# Patient Record
Sex: Male | Born: 1985 | Race: White | Hispanic: No | Marital: Married | Smoking: Never smoker
Health system: Southern US, Academic
[De-identification: ages and names within clinical notes are randomized; demographics above are authoritative.]

## PROBLEM LIST (undated history)

## (undated) DIAGNOSIS — E119 Type 2 diabetes mellitus without complications: Secondary | ICD-10-CM

## (undated) DIAGNOSIS — E7800 Pure hypercholesterolemia, unspecified: Secondary | ICD-10-CM

## (undated) DIAGNOSIS — F419 Anxiety disorder, unspecified: Secondary | ICD-10-CM

## (undated) DIAGNOSIS — G2581 Restless legs syndrome: Secondary | ICD-10-CM

## (undated) HISTORY — PX: HEMORROIDECTOMY: SUR656

## (undated) HISTORY — DX: Restless legs syndrome: G25.81

## (undated) HISTORY — DX: Type 2 diabetes mellitus without complications (CMS HCC): E11.9

## (undated) HISTORY — DX: Pure hypercholesterolemia, unspecified: E78.00

## (undated) HISTORY — DX: Anxiety disorder, unspecified: F41.9

---

## 2015-11-25 ENCOUNTER — Ambulatory Visit (HOSPITAL_COMMUNITY): Payer: Self-pay

## 2016-07-26 DIAGNOSIS — R454 Irritability and anger: Secondary | ICD-10-CM

## 2016-07-26 DIAGNOSIS — Z6841 Body Mass Index (BMI) 40.0 and over, adult: Secondary | ICD-10-CM

## 2016-07-26 DIAGNOSIS — H65191 Other acute nonsuppurative otitis media, right ear: Secondary | ICD-10-CM

## 2016-07-26 DIAGNOSIS — F419 Anxiety disorder, unspecified: Secondary | ICD-10-CM

## 2016-10-16 DIAGNOSIS — Z021 Encounter for pre-employment examination: Secondary | ICD-10-CM

## 2016-12-12 DIAGNOSIS — E1140 Type 2 diabetes mellitus with diabetic neuropathy, unspecified: Secondary | ICD-10-CM

## 2016-12-12 DIAGNOSIS — G2581 Restless legs syndrome: Secondary | ICD-10-CM

## 2016-12-12 DIAGNOSIS — F319 Bipolar disorder, unspecified: Secondary | ICD-10-CM

## 2016-12-12 DIAGNOSIS — Z6841 Body Mass Index (BMI) 40.0 and over, adult: Secondary | ICD-10-CM

## 2016-12-12 DIAGNOSIS — E785 Hyperlipidemia, unspecified: Secondary | ICD-10-CM

## 2017-04-05 DIAGNOSIS — Z6841 Body Mass Index (BMI) 40.0 and over, adult: Secondary | ICD-10-CM

## 2017-04-05 DIAGNOSIS — J208 Acute bronchitis due to other specified organisms: Secondary | ICD-10-CM

## 2017-04-05 DIAGNOSIS — E119 Type 2 diabetes mellitus without complications: Secondary | ICD-10-CM

## 2017-04-27 DIAGNOSIS — E119 Type 2 diabetes mellitus without complications: Secondary | ICD-10-CM

## 2017-04-27 DIAGNOSIS — Z6841 Body Mass Index (BMI) 40.0 and over, adult: Secondary | ICD-10-CM

## 2017-04-27 DIAGNOSIS — J208 Acute bronchitis due to other specified organisms: Secondary | ICD-10-CM

## 2017-04-27 DIAGNOSIS — G2581 Restless legs syndrome: Secondary | ICD-10-CM

## 2017-05-16 DIAGNOSIS — J208 Acute bronchitis due to other specified organisms: Secondary | ICD-10-CM

## 2017-05-16 DIAGNOSIS — E119 Type 2 diabetes mellitus without complications: Secondary | ICD-10-CM

## 2017-06-12 ENCOUNTER — Other Ambulatory Visit (INDEPENDENT_AMBULATORY_CARE_PROVIDER_SITE_OTHER): Payer: Self-pay | Admitting: Family Medicine

## 2017-06-15 ENCOUNTER — Other Ambulatory Visit (INDEPENDENT_AMBULATORY_CARE_PROVIDER_SITE_OTHER): Payer: Self-pay | Admitting: Family Medicine

## 2017-06-15 NOTE — Telephone Encounter (Signed)
PER FAX  1 PO QD QTY 90

## 2017-06-16 ENCOUNTER — Other Ambulatory Visit (INDEPENDENT_AMBULATORY_CARE_PROVIDER_SITE_OTHER): Payer: Self-pay | Admitting: Family Medicine

## 2017-06-16 MED ORDER — GLIMEPIRIDE 1 MG TABLET
1.00 mg | ORAL_TABLET | Freq: Every morning | ORAL | 1 refills | Status: DC
Start: 2017-06-16 — End: 2017-06-18

## 2017-06-18 ENCOUNTER — Other Ambulatory Visit (INDEPENDENT_AMBULATORY_CARE_PROVIDER_SITE_OTHER): Payer: Self-pay | Admitting: Family Medicine

## 2017-06-18 MED ORDER — GLIMEPIRIDE 1 MG TABLET
1.0000 mg | ORAL_TABLET | Freq: Every morning | ORAL | 1 refills | Status: AC
Start: 2017-06-18 — End: ?

## 2017-07-01 ENCOUNTER — Other Ambulatory Visit (INDEPENDENT_AMBULATORY_CARE_PROVIDER_SITE_OTHER): Payer: Self-pay | Admitting: Family Medicine

## 2017-07-08 ENCOUNTER — Other Ambulatory Visit (INDEPENDENT_AMBULATORY_CARE_PROVIDER_SITE_OTHER): Payer: Self-pay | Admitting: Family Medicine

## 2017-07-10 ENCOUNTER — Other Ambulatory Visit (INDEPENDENT_AMBULATORY_CARE_PROVIDER_SITE_OTHER): Payer: Self-pay | Admitting: Family Medicine

## 2017-07-25 ENCOUNTER — Other Ambulatory Visit (INDEPENDENT_AMBULATORY_CARE_PROVIDER_SITE_OTHER): Payer: Self-pay | Admitting: Family Medicine

## 2017-07-25 MED ORDER — CITALOPRAM 40 MG TABLET
40.0000 mg | ORAL_TABLET | Freq: Every day | ORAL | 1 refills | Status: DC
Start: 2017-07-25 — End: 2018-02-01

## 2017-07-26 ENCOUNTER — Encounter (INDEPENDENT_AMBULATORY_CARE_PROVIDER_SITE_OTHER): Payer: Self-pay | Admitting: Family Medicine

## 2017-07-31 ENCOUNTER — Other Ambulatory Visit (INDEPENDENT_AMBULATORY_CARE_PROVIDER_SITE_OTHER): Payer: Self-pay | Admitting: Family Medicine

## 2017-07-31 MED ORDER — LIRAGLUTIDE 0.6 MG/0.1 ML (18 MG/3 ML) SUBCUTANEOUS PEN INJECTOR
1.2000 mg | PEN_INJECTOR | Freq: Every day | SUBCUTANEOUS | 1 refills | Status: DC
Start: 2017-07-31 — End: 2017-08-01

## 2017-08-01 ENCOUNTER — Other Ambulatory Visit (INDEPENDENT_AMBULATORY_CARE_PROVIDER_SITE_OTHER): Payer: Self-pay | Admitting: Family Medicine

## 2017-08-01 MED ORDER — LIRAGLUTIDE 0.6 MG/0.1 ML (18 MG/3 ML) SUBCUTANEOUS PEN INJECTOR
1.2000 mg | PEN_INJECTOR | Freq: Every day | SUBCUTANEOUS | 1 refills | Status: DC
Start: 2017-08-01 — End: 2017-08-09

## 2017-08-09 ENCOUNTER — Encounter (INDEPENDENT_AMBULATORY_CARE_PROVIDER_SITE_OTHER): Payer: Self-pay | Admitting: Family Medicine

## 2017-08-09 ENCOUNTER — Encounter (INDEPENDENT_AMBULATORY_CARE_PROVIDER_SITE_OTHER): Payer: Self-pay

## 2017-08-09 ENCOUNTER — Other Ambulatory Visit: Payer: 59 | Attending: Family Medicine | Admitting: Family Medicine

## 2017-08-09 ENCOUNTER — Ambulatory Visit (INDEPENDENT_AMBULATORY_CARE_PROVIDER_SITE_OTHER): Payer: 59 | Admitting: Family Medicine

## 2017-08-09 ENCOUNTER — Other Ambulatory Visit (INDEPENDENT_AMBULATORY_CARE_PROVIDER_SITE_OTHER): Payer: Self-pay | Admitting: Family Medicine

## 2017-08-09 VITALS — BP 124/86 | HR 63 | Temp 97.3°F | Resp 14 | Ht 72.0 in | Wt 334.0 lb

## 2017-08-09 DIAGNOSIS — E782 Mixed hyperlipidemia: Secondary | ICD-10-CM | POA: Insufficient documentation

## 2017-08-09 DIAGNOSIS — F419 Anxiety disorder, unspecified: Secondary | ICD-10-CM

## 2017-08-09 DIAGNOSIS — G2581 Restless legs syndrome: Secondary | ICD-10-CM

## 2017-08-09 DIAGNOSIS — E119 Type 2 diabetes mellitus without complications: Secondary | ICD-10-CM | POA: Insufficient documentation

## 2017-08-09 DIAGNOSIS — Z6841 Body Mass Index (BMI) 40.0 and over, adult: Secondary | ICD-10-CM

## 2017-08-09 LAB — BASIC METABOLIC PANEL, FASTING
BUN/CREA RATIO: 8
BUN: 10 mg/dL (ref 10–25)
CALCIUM: 9.3 mg/dL (ref 8.2–10.2)
CHLORIDE: 106 mmol/L (ref 98–111)
CO2 TOTAL: 21 mmol/L (ref 21–35)
CREATININE: 1.2 mg/dL (ref <=1.30)
ESTIMATED GFR: >60 mL/min/{1.73_m2}
GLUCOSE: 113 mg/dL — ABNORMAL HIGH (ref 70–110)
POTASSIUM: 3.7 mmol/L (ref 3.5–5.0)
SODIUM: 137 mmol/L (ref 135–145)

## 2017-08-09 LAB — CBC WITH DIFF
BASOPHIL #: 0.1 10*3/uL (ref 0.00–0.20)
BASOPHIL #: 0.1 10*3/uL (ref 0.00–0.20)
BASOPHIL %: 1 %
EOSINOPHIL #: 0.2 10*3/uL (ref 0.00–0.50)
EOSINOPHIL %: 2 %
HCT: 42.9 % (ref 38.9–50.5)
HCT: 42.9 % (ref 38.9–50.5)
HGB: 14.8 g/dL (ref 13.4–17.3)
LYMPHOCYTE #: 3.9 10*3/uL — ABNORMAL HIGH (ref 0.80–3.20)
LYMPHOCYTE %: 41 %
MCH: 29.5 pg (ref 27.9–33.1)
MCHC: 34.5 g/dL (ref 32.8–36.0)
MCV: 85.5 fL (ref 82.4–95.0)
MONOCYTE #: 0.6 10*3/uL (ref 0.20–0.80)
MONOCYTE %: 7 %
MPV: 8.4 fL (ref 6.0–10.2)
NEUTROPHIL #: 4.8 10*3/uL (ref 1.60–5.50)
NEUTROPHIL %: 50 %
PLATELETS: 290 10*3/uL (ref 140–440)
RBC: 5.01 10*6/uL (ref 4.40–5.68)
RDW: 13.7 % (ref 10.9–15.1)
WBC: 9.5 10*3/uL — ABNORMAL HIGH (ref 3.3–9.3)

## 2017-08-09 LAB — HEPATIC FUNCTION PANEL
ALBUMIN: 4.4 g/dL (ref 3.2–4.6)
ALKALINE PHOSPHATASE: 74 U/L (ref 20–130)
ALT (SGPT): 38 U/L (ref <=52)
AST (SGOT): 23 U/L (ref <=35)
BILIRUBIN DIRECT: 0.1 mg/dL (ref <=0.3)
BILIRUBIN TOTAL: 0.5 mg/dL (ref 0.3–1.2)
PROTEIN TOTAL: 6.9 g/dL (ref 6.0–8.3)

## 2017-08-09 LAB — LIPID PANEL
CHOLESTEROL: 186 mg/dL (ref 0–199)
HDL CHOL: 41 mg/dL (ref >40)
LDL DIRECT: 112 mg/dL — ABNORMAL HIGH (ref 0–99)
TRIGLYCERIDES: 354 mg/dL — ABNORMAL HIGH (ref 0–199)
VLDL CALC: 71 mg/dL — ABNORMAL HIGH (ref 0–50)

## 2017-08-09 MED ORDER — LIRAGLUTIDE 0.6 MG/0.1 ML (18 MG/3 ML) SUBCUTANEOUS PEN INJECTOR
1.2000 mg | PEN_INJECTOR | Freq: Every day | SUBCUTANEOUS | 4 refills | Status: AC
Start: 2017-08-09 — End: ?

## 2017-08-09 NOTE — Nursing Note (Signed)
08/09/17 0805   Adult Vitals   Initials KG   BP (Non-Invasive) 124/86   Temperature 36.3 C (97.3 F)   Temp src Oral   Heart Rate 63   Respiratory Rate 14   SpO2-1 98 %   Height 1.829 m (6')   Weight (!) 151.5 kg (334 lb)

## 2017-08-09 NOTE — Progress Notes (Signed)
[facility]  [facility]  [address], [address]  [name] [facility] [place] [id number]  [phone]    Francisco Hebert 08/09/2017   [id number] [birth year]     Chief Complaint: Follow Up 3 Months (DM 2)      Subjective: Francisco Hebert is a 31 y.o. male presenting with follow-up of diabetes mellitus type 2.  He feels that his fingersticks are still doing really bad.  He states that some mornings he has fingersticks as high as 300.  He has notice that if he drinks a lot of water is fingersticks can come down into the 100s.  He is using Victoza 1.2 mg weekly but feels that he is not getting good control of his blood sugars.  He is also not lost any weight.  He states he is exercising when trying to eat right.  He had his flu shot last week.  Is not having any problems with any other medication.  He feels his mood is doing well.  Has some end of the day swelling of his legs.  He is fasting today for labs.    Medical History     I have reviewed and updated as appropriate the past medical, family and social history today:  Medical History/Surgical History/Family History  No past medical history on file.  {Medical History updated    No past surgical history on file.  Family Medical History:     None            Allergies:  Allergies   Allergen Reactions   . Cefdinir    . Diclofenac Potassium      Medications:  Current Outpatient Prescriptions   Medication Sig   . atorvastatin (LIPITOR) 10 mg Oral Tablet TAKE 1 TABLET BY MOUTH EVERY DAY   . busPIRone (BUSPAR) 15 mg Oral Tablet Take 0.5 tablets twice a day by oral route.   . citalopram (CELEXA) 40 mg Oral Tablet Take 1 Tab (40 mg total) by mouth Once a day   . FLUZONE QUAD 2018-2019, PF, 60 mcg (15 mcg x 4)/0.5 mL Intramuscular Syringe TO BE ADMINISTERED BY PHARMACIST FOR IMMUNIZATION   . glimepiride (AMARYL) 1 mg Oral Tablet Take 1 Tab (1 mg total) by mouth Every morning   . liraglutide (VICTOZA 2-PAK) 0.6 mg/0.1 mL (18 mg/3 mL) Subcutaneous  Pen Injector 1.2 mg by Subcutaneous route Once a day   . NOVOFINE 32 32 gauge x 1/4" Needle USE FOR INJECTION DAILY   . ONETOUCH ULTRA BLUE TEST STRIP Does not apply Strip USE FOR TESTING TWICE A DAY   . topiramate (TOPAMAX) 50 mg Oral Tablet TAKE 1 TABLET BY MOUTH ONCE DAILY X7 DAYS.THEN TAKE 1 TABLET TWICE A DAY   . traZODone (DESYREL) 150 mg Oral Tablet Take 75 mg by mouth Once a day     Immunization History:    There is no immunization history on file for this patient.  Problem List:  There are no active problems to display for this patient.      Review of Systems:  Constitutional:within normal limits   HEENT: within normal limits  Respiratory: within normal limits  Cardiovascular: within normal limits  Gastrointestinal: within normal limits   GU: dysuria No hematuria No   frequency No nocturia No  Musculoskeletal: within normal limits   Neurological: within normal limits   Psych; normal mood and affect  skin: Denies rashes or skin changes  All other ROS Negative  Objective   Vitals:  BP 124/86  Pulse 63  Temp 36.3 C (97.3 F) (Oral)   Resp 14  Ht 1.829 m (6')  Wt (!) 151.5 kg (334 lb)  SpO2 98%  BMI 45.3 kg/m2  Wt Readings from Last 5 Encounters:   08/09/17 (!) 151.5 kg (334 lb)     Body mass index is 45.3 kg/(m^2).    Physical Exam:  Constitutional: well developed well nourished, in no acute distress  HEENT: Conjunctiva normal, external ears normal, no nasal drainage  Neck: Normal alignment, FROM, full strength, no instability  Cardiac: RRR, no murmurs, rubs or gallops  Pulmonary: Clear Bilaterally  ABD: non distended, normal BS   MS:   Equal muscle strength  Neuro: negative  Skin:   No rash  Psych: normal mood and affect    Laboratory Studies/Data Reviewed     I have reviewed available lab studies and updated the patient on the results.  No results found for any previous visit.  I have reviewed all health maintenance items with the patient.  Health Maintenance   Topic Date Due   . Depression  Screening  11/20/1996   . HIV Screening  11/21/1999   . Adult Tdap-Td (1 - Tdap) 11/21/2003   . Pneumococcal 19-64 Years Medium Risk (1 of 1 - PPSV23) 11/21/2003   . Influenza Vaccine (1) 06/02/2017       Assessment/Plan   Diagnosis    ICD-10-CM    1. DM2 (diabetes mellitus, type 2) (CMS HCC) E11.9 CBC/DIFF     BASIC METABOLIC PANEL, FASTING     HGA1C (HEMOGLOBIN A1C WITH EST AVG GLUCOSE)     POCT BLOOD DRAW (AMB)   2. 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 RLS (restless legs syndrome) G25.81    4. Anxiety F41.9        Plan  We will obtain fasting labs today.  Tells me that his Victoza is costing him over 100 dollars a month.  I gave him an insurance card that should reduce his co-pay.  After a sees A1c I may increase his Victoza to 1.8 mg he is also supposed to check and see if he can obtain a formulary for his insurance.  I encouraged him to keep exercising and eating right.  Will see him again in about 4 months.  BMI addressed: Advised on diet, weight loss, and exercise to reduce above normal BMI.              Levert FeinsteinFrank D Stefania Goulart, DO  08/09/2017, 08:21

## 2017-08-10 LAB — HGA1C (HEMOGLOBIN A1C WITH EST AVG GLUCOSE)
ESTIMATED AVERAGE GLUCOSE: 169 mg/dL
HEMOGLOBIN A1C: 7.5 % — ABNORMAL HIGH (ref 4.1–5.7)

## 2017-08-16 ENCOUNTER — Encounter (INDEPENDENT_AMBULATORY_CARE_PROVIDER_SITE_OTHER): Payer: Self-pay | Admitting: Family Medicine

## 2017-08-17 ENCOUNTER — Other Ambulatory Visit (INDEPENDENT_AMBULATORY_CARE_PROVIDER_SITE_OTHER): Payer: Self-pay | Admitting: Family Medicine

## 2017-08-17 MED ORDER — ATORVASTATIN 20 MG TABLET
20.0000 mg | ORAL_TABLET | Freq: Every day | ORAL | 2 refills | Status: AC
Start: 2017-08-17 — End: ?

## 2017-09-14 ENCOUNTER — Other Ambulatory Visit (INDEPENDENT_AMBULATORY_CARE_PROVIDER_SITE_OTHER): Payer: Self-pay | Admitting: Family Medicine

## 2017-09-14 MED ORDER — BLOOD-GLUCOSE METER
1.0000 | Freq: Two times a day (BID) | 0 refills | Status: AC
Start: 2017-09-14 — End: ?

## 2017-09-14 MED ORDER — BLOOD SUGAR DIAGNOSTIC STRIPS
1.0000 | ORAL_STRIP | Freq: Two times a day (BID) | 4 refills | Status: DC
Start: 2017-09-14 — End: 2017-12-14

## 2017-10-17 ENCOUNTER — Other Ambulatory Visit (INDEPENDENT_AMBULATORY_CARE_PROVIDER_SITE_OTHER): Payer: Self-pay | Admitting: Family Medicine

## 2017-11-05 ENCOUNTER — Other Ambulatory Visit (INDEPENDENT_AMBULATORY_CARE_PROVIDER_SITE_OTHER): Payer: Self-pay | Admitting: Family Medicine

## 2017-12-07 ENCOUNTER — Encounter (INDEPENDENT_AMBULATORY_CARE_PROVIDER_SITE_OTHER): Payer: Self-pay | Admitting: Family Medicine

## 2017-12-14 ENCOUNTER — Other Ambulatory Visit (INDEPENDENT_AMBULATORY_CARE_PROVIDER_SITE_OTHER): Payer: Self-pay | Admitting: Family Medicine

## 2018-02-01 ENCOUNTER — Other Ambulatory Visit (INDEPENDENT_AMBULATORY_CARE_PROVIDER_SITE_OTHER): Payer: Self-pay | Admitting: Family Medicine

## 2020-05-26 ENCOUNTER — Other Ambulatory Visit: Payer: Self-pay

## 2020-12-15 ENCOUNTER — Other Ambulatory Visit: Payer: Self-pay | Admitting: Nurse Practitioner

## 2020-12-15 DIAGNOSIS — N289 Disorder of kidney and ureter, unspecified: Secondary | ICD-10-CM

## 2020-12-16 ENCOUNTER — Ambulatory Visit
Admission: RE | Admit: 2020-12-16 | Discharge: 2020-12-16 | Disposition: A | Discharge status: Home / Self Care | Payer: Commercial Managed Care - PPO | Source: Ambulatory Visit | Attending: Nurse Practitioner | Admitting: Nurse Practitioner

## 2020-12-16 DIAGNOSIS — N289 Disorder of kidney and ureter, unspecified: Secondary | ICD-10-CM

## 2021-10-04 ENCOUNTER — Encounter (HOSPITAL_COMMUNITY): Payer: Self-pay

## 2021-10-04 ENCOUNTER — Emergency Department (HOSPITAL_COMMUNITY)
Admission: EM | Admit: 2021-10-04 | Discharge: 2021-10-05 | Disposition: A | Discharge status: Home / Self Care | Payer: Commercial Managed Care - PPO | Attending: Emergency Medicine | Admitting: Emergency Medicine

## 2021-10-04 ENCOUNTER — Other Ambulatory Visit: Payer: Self-pay

## 2021-10-04 DIAGNOSIS — Z79899 Other long term (current) drug therapy: Secondary | ICD-10-CM | POA: Diagnosis not present

## 2021-10-04 DIAGNOSIS — R110 Nausea: Secondary | ICD-10-CM | POA: Diagnosis not present

## 2021-10-04 DIAGNOSIS — R1011 Right upper quadrant pain: Secondary | ICD-10-CM | POA: Insufficient documentation

## 2021-10-04 HISTORY — DX: Type 2 diabetes mellitus without complications: E11.9

## 2021-10-04 LAB — COMPREHENSIVE METABOLIC PANEL
ALT: 38 U/L (ref 0–44)
AST: 34 U/L (ref 15–41)
Albumin: 4.5 g/dL (ref 3.5–5.0)
Alkaline Phosphatase: 30 U/L — ABNORMAL LOW (ref 38–126)
Anion gap: 8 (ref 5–15)
BUN: 17 mg/dL (ref 6–20)
CO2: 21 mmol/L — ABNORMAL LOW (ref 22–32)
Calcium: 9.3 mg/dL (ref 8.9–10.3)
Chloride: 108 mmol/L (ref 98–111)
Creatinine, Ser: 1.38 mg/dL — ABNORMAL HIGH (ref 0.61–1.24)
GFR, Estimated: >60 mL/min (ref >60)
Glucose, Bld: 91 mg/dL (ref 70–99)
Potassium: 3.9 mmol/L (ref 3.5–5.1)
Sodium: 137 mmol/L (ref 135–145)
Total Bilirubin: 0.4 mg/dL (ref 0.3–1.2)
Total Protein: 7.6 g/dL (ref 6.5–8.1)

## 2021-10-04 LAB — CBC
HCT: 44 % (ref 39.0–52.0)
Hemoglobin: 14.7 g/dL (ref 13.0–17.0)
MCH: 28.8 pg (ref 26.0–34.0)
MCHC: 33.4 g/dL (ref 30.0–36.0)
MCV: 86.1 fL (ref 80.0–100.0)
Platelets: 423 10*3/uL — ABNORMAL HIGH (ref 150–400)
RBC: 5.11 MIL/uL (ref 4.22–5.81)
RDW: 12.9 % (ref 11.5–15.5)
WBC: 11.5 10*3/uL — ABNORMAL HIGH (ref 4.0–10.5)
nRBC: 0 % (ref 0.0–0.2)

## 2021-10-04 LAB — LIPASE, BLOOD: Lipase: 56 U/L — ABNORMAL HIGH (ref 11–51)

## 2021-10-04 NOTE — ED Triage Notes (Signed)
Reports he hit the ground due to pain in right upper quad at work.  +diaphoresis

## 2021-10-05 ENCOUNTER — Emergency Department (HOSPITAL_COMMUNITY): Payer: Commercial Managed Care - PPO

## 2021-10-05 LAB — URINALYSIS, ROUTINE W REFLEX MICROSCOPIC
Bilirubin Urine: NEGATIVE
Glucose, UA: NEGATIVE mg/dL
Hgb urine dipstick: NEGATIVE
Ketones, ur: NEGATIVE mg/dL
Leukocytes,Ua: NEGATIVE
Nitrite: NEGATIVE
Protein, ur: NEGATIVE mg/dL
Specific Gravity, Urine: 1.025 (ref 1.005–1.030)
pH: 6 (ref 5.0–8.0)

## 2021-10-05 LAB — CBG MONITORING, ED: Glucose-Capillary: 117 mg/dL — ABNORMAL HIGH (ref 70–99)

## 2021-10-05 MED ORDER — HYDROXYZINE HCL 25 MG PO TABS
25.0000 mg | ORAL_TABLET | Freq: Once | ORAL | Status: AC
  Administered 2021-10-05: 25 mg via ORAL
  Filled 2021-10-05: qty 1

## 2021-10-05 NOTE — ED Provider Notes (Signed)
[facility] EMERGENCY DEPARTMENT Provider Note   CSN: [id number] Arrival date & time: 10/04/21  1524     History  Chief Complaint  Patient presents with   Abdominal Pain    Maxwell Crawford is a 36 y.o. male who presents to the ED today with complaint of gradual onset, intermittent, sharp/stabbing, RUQ abdominal pain that began yesterday. Pt reports the pain started after eating a banana. He states he dropped to the ground due to the amount of pain he was in. He denies LOC or head injury. He states that since then the pain has been intermittent but not as severe and currently he has 0/10 pain. He does mention issues with similar pain in the past - he states he was seen by his PCP for same and had labwork done which did not show any acute findings. Pt is concerned it could be his gallbladder. He states he was nauseated but denies vomiting. No diarrhea. Denies fevers however states he had cold chills with the pain. He does mention the pain radiates into his back. Denies urinary symptoms including hematuria.   The history is provided by the patient and medical records.      Home Medications Prior to Admission medications   Medication Sig Start Date End Date Taking? Authorizing Provider  atorvastatin (LIPITOR) 10 MG tablet Take 10 mg by mouth daily. 09/08/21  Yes [provider]  buPROPion (WELLBUTRIN XL) 150 MG 24 hr tablet Take 150 mg by mouth daily. 08/10/20  Yes [provider]  fenofibrate 160 MG tablet Take 160 mg by mouth daily. 09/08/21  Yes [provider]  hydrOXYzine (ATARAX) 10 MG tablet Take 10 mg by mouth daily. 08/23/21  Yes [provider]  metFORMIN (GLUCOPHAGE-XR) 500 MG 24 hr tablet Take 500 mg by mouth 2 (two) times daily. 09/08/21  Yes [provider]  rOPINIRole (REQUIP) 1 MG tablet Take 1 mg by mouth at bedtime. 08/23/21  Yes [provider]  traZODone (DESYREL) 100 MG tablet Take 100 mg by mouth  at bedtime. 08/11/21  Yes [provider]  TRULICITY [id number] SOPN Inject 0.75 mg into the skin every 14 (fourteen) days. 08/11/21  Yes [provider]      Allergies    Diclofenac, Cefdinir, Other, Citalopram, and Lorazepam    Review of Systems   Review of Systems  Constitutional:  Positive for chills. Negative for fever.  Cardiovascular:  Negative for chest pain.  Gastrointestinal:  Positive for abdominal pain and nausea. Negative for constipation, diarrhea and vomiting.  Neurological:  Negative for syncope.  All other systems reviewed and are negative.  Physical Exam Updated Vital Signs BP 125/87    Pulse 72    Temp 98.6 F (37 C) (Oral)    Resp 16    Ht 6' (1.829 m)    Wt 136.1 kg    SpO2 99%    BMI 40.69 kg/m  Physical Exam Vitals and nursing note reviewed.  Constitutional:      Appearance: He is obese. He is not ill-appearing or diaphoretic.  HENT:     Head: Normocephalic and atraumatic.  Eyes:     Conjunctiva/sclera: Conjunctivae normal.  Cardiovascular:     Rate and Rhythm: Normal rate and regular rhythm.     Heart sounds: Normal heart sounds.  Pulmonary:     Effort: Pulmonary effort is normal.     Breath sounds: Normal breath sounds. No wheezing, rhonchi or rales.  Abdominal:  General: Bowel sounds are normal.     Palpations: Abdomen is soft.     Tenderness: There is no abdominal tenderness. There is no right CVA tenderness, left CVA tenderness, guarding or rebound.     Comments: Soft, no obvious TTP on exam however pt points to his RUQ when describing pain, +BS throughout, no r/g/r, neg murphy's, neg mcburney's, no CVA TTP  Musculoskeletal:     Cervical back: Neck supple.  Skin:    General: Skin is warm and dry.  Neurological:     Mental Status: He is alert.    ED Results / Procedures / Treatments   Labs (all labs ordered are listed, but only abnormal results are displayed) Labs Reviewed  LIPASE, BLOOD - Abnormal; Notable for the  following components:      Result Value   Lipase 56 (*)    All other components within normal limits  COMPREHENSIVE METABOLIC PANEL - Abnormal; Notable for the following components:   CO2 21 (*)    Creatinine, Ser 1.38 (*)    Alkaline Phosphatase 30 (*)    All other components within normal limits  CBC - Abnormal; Notable for the following components:   WBC 11.5 (*)    Platelets 423 (*)    All other components within normal limits  CBG MONITORING, ED - Abnormal; Notable for the following components:   Glucose-Capillary 117 (*)    All other components within normal limits  URINALYSIS, ROUTINE W REFLEX MICROSCOPIC    EKG None  Radiology No results found.  Procedures Procedures    Medications Ordered in ED Medications - No data to display  ED Course/ Medical Decision Making/ A&P                           Medical Decision Making 36 year old male who presents to the ED today with complaint of intermittent right upper quadrant pain, severe nature yesterday after eating a banana with associated nausea and chills.  On arrival to the ED today vitals are stable.  Patient was medically screened and work-up started including lab work.  BC with mild elevation in leukocytosis at 11,500.  Hemoglobin stable at 14.7.  Platelets 423.  No other abnormalities.  CMP with creatinine of 1.38, does appear baseline for patient; 17.  Kos 91, bicarb 21.  No gap.  Lipase slightly elevated at 56 however not consistent with pancreatitis.  We will plan for right upper quadrant ultrasound for further evaluation at this time.  Currently on exam patient is pain-free, nontender to palpation throughout the abdomen.  No CVA tenderness palpation however patient does state that the pain radiates to his back.  Awaiting urinalysis at this time.  If right upper quadrant ultrasound without any acute findings may consider CT renal stone study for further evaluation.  Amount and/or Complexity of Data Reviewed Labs:  ordered. Decision-making details documented in ED Course. Radiology: ordered. Decision-making details documented in ED Course.   At shift change case signed out to [facility], [place], who will follow up on RUQ ultrasound/urinalysis and reevaluate.         Final Clinical Impression(s) / ED Diagnoses Final diagnoses:  RUQ abdominal pain    Rx / DC Orders ED Discharge Orders     None         Eustaquio Maize, PA-C 10/05/21 [id number]    Veryl Speak, MD 10/05/21 [phone]

## 2021-10-05 NOTE — ED Notes (Signed)
Patient states "I just need my gallbladder looked at I dont want you taking my vitals"

## 2021-10-05 NOTE — Discharge Instructions (Signed)
You are seen in today for evaluation of your right upper quadrant abdominal pain.  You have an enlarged liver with an area of irregularity, but you report this is already known to you.  I have referred you to the bowel gastroenterology for follow-up on this.  Please see your PCP for reevaluation and further investigation into this pain.  If you have any concern, new or worsening symptoms, please return the nearest [facility] department for evaluation.

## 2021-10-05 NOTE — ED Notes (Signed)
Pt refused to be on cardiac monitor. Pt stated, " I have been here since 3pm yesterday." Pts wife is at bedside.

## 2021-10-05 NOTE — ED Provider Notes (Signed)
Accepted handoff at shift change from [facility], [place]. Please see prior provider note for more detail.   Briefly: Patient is 36 y.o. with RUQ pain  DDX: concern for cholecystitis vs. Cholelithiasis vs. Renal stone  Plan: Pending [place], possible CT scan. Most likely discharge.  Physical Exam  BP 130/89    Pulse 82    Temp 98.6 F (37 C) (Oral)    Resp 17    Ht 6' (1.829 m)    Wt 136.1 kg    SpO2 98%    BMI 40.69 kg/m   Physical Exam  ED Course/Procedures    Results for orders placed or performed during the hospital encounter of 10/04/21  Lipase, blood  Result Value Ref Range   Lipase 56 (H) 11 - 51 U/L  Comprehensive metabolic panel  Result Value Ref Range   Sodium 137 135 - 145 mmol/L   Potassium 3.9 3.5 - 5.1 mmol/L   Chloride 108 98 - 111 mmol/L   CO2 21 (L) 22 - 32 mmol/L   Glucose, Bld 91 70 - 99 mg/dL   BUN 17 6 - 20 mg/dL   Creatinine, Ser 1.38 (H) 0.61 - 1.24 mg/dL   Calcium 9.3 8.9 - 10.3 mg/dL   Total Protein 7.6 6.5 - 8.1 g/dL   Albumin 4.5 3.5 - 5.0 g/dL   AST 34 15 - 41 U/L   ALT 38 0 - 44 U/L   Alkaline Phosphatase 30 (L) 38 - 126 U/L   Total Bilirubin 0.4 0.3 - 1.2 mg/dL   GFR, Estimated >60 >60 mL/min   Anion gap 8 5 - 15  CBC  Result Value Ref Range   WBC 11.5 (H) 4.0 - 10.5 K/uL   RBC 5.11 4.22 - 5.81 MIL/uL   Hemoglobin 14.7 13.0 - 17.0 g/dL   HCT 44.0 39.0 - 52.0 %   MCV 86.1 80.0 - 100.0 fL   MCH 28.8 26.0 - 34.0 pg   MCHC 33.4 30.0 - 36.0 g/dL   RDW 12.9 11.5 - 15.5 %   Platelets 423 (H) 150 - 400 K/uL   nRBC 0.0 0.0 - 0.2 %  Urinalysis, Routine w reflex microscopic Urine, Clean Catch  Result Value Ref Range   Color, Urine YELLOW YELLOW   APPearance CLEAR CLEAR   Specific Gravity, Urine 1.025 1.005 - 1.030   pH 6.0 5.0 - 8.0   Glucose, UA NEGATIVE NEGATIVE mg/dL   Hgb urine dipstick NEGATIVE NEGATIVE   Bilirubin Urine NEGATIVE NEGATIVE   Ketones, ur NEGATIVE NEGATIVE mg/dL   Protein, ur NEGATIVE NEGATIVE mg/dL   Nitrite NEGATIVE  NEGATIVE   Leukocytes,Ua NEGATIVE NEGATIVE  CBG monitoring, ED  Result Value Ref Range   Glucose-Capillary 117 (H) 70 - 99 mg/dL   Comment 1 Notify RN    Comment 2 Document in Chart    CT Renal Stone Study  Result Date: 10/05/2021 CLINICAL DATA:  Right flank pain EXAM: CT ABDOMEN AND PELVIS WITHOUT CONTRAST TECHNIQUE: Multidetector CT imaging of the abdomen and pelvis was performed following the standard protocol without IV contrast. COMPARISON:  Sonogram done earlier today FINDINGS: Lower chest: Unremarkable. Hepatobiliary: Liver measures 24.1 cm in length. There is fatty infiltration. There is subtle ill-defined increased density in the posterior right lobe in image 35 of series 3. Evaluation of this finding is limited in this noncontrast study. There is no dilation of bile ducts. Gallbladder is unremarkable. Pancreas: No focal abnormality is seen. Spleen: Unremarkable. Adrenals/Urinary Tract: Adrenals are unremarkable. There  is no hydronephrosis. There are no renal or ureteral stones. Urinary bladder is unremarkable. Stomach/Bowel: Stomach is not distended. Small bowel loops are not dilated. Appendix is not dilated. There is no significant wall thickening in colon. There is no pericolic stranding. Vascular/Lymphatic: Unremarkable. Reproductive: Unremarkable. Other: There is no ascites or pneumoperitoneum. Small umbilical hernia containing fat is seen. Small bilateral inguinal hernias containing fat are noted. Musculoskeletal: Unremarkable. IMPRESSION: There is no evidence intestinal obstruction or pneumoperitoneum. Appendix is not dilated. There is no hydronephrosis. Enlarged fatty liver. Other findings as described in the body of the report. Electronically Signed   By: Elmer Picker M.D.   On: 10/05/2021 09:21   US Abdomen Limited RUQ (LIVER/GB)  Result Date: 10/05/2021 CLINICAL DATA:  RIGHT upper quadrant abdominal pain EXAM: ULTRASOUND ABDOMEN LIMITED RIGHT UPPER QUADRANT COMPARISON:   12/16/2020 FINDINGS: Gallbladder: Normally distended without stones or wall thickening. No pericholecystic fluid or sonographic Murphy sign. Common bile duct: Diameter: 5 mm, normal Liver: Echogenic parenchyma, likely fatty infiltration though this can be seen with cirrhosis and certain infiltrative disorders. No focal hepatic mass or nodularity. Portal vein is patent on color Doppler imaging with normal direction of blood flow towards the liver. Other: No RIGHT upper quadrant free fluid. IMPRESSION: Probable fatty infiltration of liver as above. Otherwise negative exam. Electronically Signed   By: Lavonia Dana M.D.   On: 10/05/2021 08:25     Procedures  MDM   For this patient in handoff.  Please see previous note for additional information.  On my interview and evaluation, the patient reported he has had this pain off and on "for years".  On my exam, his abdomen is nontender.  He denies any nausea or vomiting but reports he is having some anxiety.  Hydroxyzine given.  CMP shows elevated creatinine 1.38 but no electrolyte abnormalities.  Slightly decreased bicarb of 21.  Urinalysis is negative.  Lipase slightly elevated at 56.  Slightly elevated white blood cell count and platelets however the patient's H&H is on the high side of normal, so this is likely concentrated.  Vital signs are stable.  Patient is normotensive, afebrile, normal pulse rate, satting well on room air with normal work of breathing.  Ultrasound showed fatty liver with no right upper quadrant free fluid.  CT renal showed no evidence of intestinal obstruction or pneumoperitoneum.  No hydronephrosis.  Just enlarged fatty liver seen.  There was an area of subtle ill-defined increased density in the posterior right lobe.  When I was discussing the imaging findings with the patient, he reports he is had that problem evaluated "years ago" and knows he also has a fatty liver.  With the reassuring lab work and negative imaging, the patient is safe  for discharge.  I discussed return precautions with the patient.  Recommended follow-up with GI and his PCP.  Patient agrees to plan.  Patient is stable and being discharged home in good condition.  I discussed this case with my attending physician who cosigned this note including patient's presenting symptoms, physical exam, and planned diagnostics and interventions. Attending physician stated agreement with plan or made changes to plan which were implemented.     Sherrell Puller, PA-C 10/05/21 [place], MD 10/05/21 1036

## 2021-11-04 ENCOUNTER — Encounter: Payer: Self-pay | Admitting: Behavioral Health

## 2021-11-04 ENCOUNTER — Ambulatory Visit (INDEPENDENT_AMBULATORY_CARE_PROVIDER_SITE_OTHER): Payer: Commercial Managed Care - PPO | Admitting: Behavioral Health

## 2021-11-04 ENCOUNTER — Other Ambulatory Visit: Payer: Self-pay

## 2021-11-04 VITALS — BP 133/85 | HR 70 | Ht 72.0 in | Wt 300.0 lb

## 2021-11-04 DIAGNOSIS — F411 Generalized anxiety disorder: Secondary | ICD-10-CM

## 2021-11-04 DIAGNOSIS — F331 Major depressive disorder, recurrent, moderate: Secondary | ICD-10-CM

## 2021-11-04 DIAGNOSIS — F39 Unspecified mood [affective] disorder: Secondary | ICD-10-CM

## 2021-11-04 MED ORDER — VILAZODONE HCL 20 MG PO TABS: 20.0000 mg | ORAL_TABLET | Freq: Every day | ORAL | 1 refills | Status: DC

## 2021-11-04 MED ORDER — HYDROXYZINE HCL 25 MG PO TABS: 25.0000 mg | ORAL_TABLET | Freq: Three times a day (TID) | ORAL | 0 refills | Status: DC | PRN

## 2021-11-04 NOTE — Progress Notes (Signed)
[facility] MD/PA/NP Initial Note  11/04/2021 10:13 AM Maxwell Crawford  MRN:  [id number]  Chief Complaint:  Chief Complaint   Anxiety; Depression; Medication Problem; Establish Care; Medication Refill; Irritability     HPI:  73 year olf male presents to this office for initial visit and to establish care. He says that he has been struggling with anxiety and depression for the last 16 years. He say, "my moods are all over the place, but I stay irritably and get angry very easily".  Says that he has had to move a lot for work but has primarily seen physicians in different towns and has tried multiple antidepressants over the years. He says that a few of them have brought partial relief but most have not worked very well at controlling his symptoms. He says that he decided to establish with psychiatry to get help. He is interested in doing GeneSight testing today. He would like to try a new medication that will assist in lowering his anxiety and depression levels. He endorses racing thoughts, irritability, hyperactivity at times, talkativeness, and trouble concentrating. Says his anxiety level today is 5/10 and depression is 4/10. He does sleep 7 hours per night most of the time. He denies any current mania. No psychosis. No SI/HI.   Prior psychiatric medication trial:  Abilify Zoloft Citalopram Lexapro Wellbutrin Effexor Cymbalta Gabapentin Hydroxyzine Trazodone    Visit Diagnosis:    ICD-10-CM   1. Generalized anxiety disorder  F41.1 hydrOXYzine (ATARAX) 25 MG tablet    Vilazodone HCl 20 MG TABS    2. Major depressive disorder, recurrent episode, moderate (HCC)  F33.1 Vilazodone HCl 20 MG TABS    3. Unspecified mood (affective) disorder (HCC)  F39 Vilazodone HCl 20 MG TABS      Past Psychiatric History: Gen. Anxiety, MDD   Past Medical History:  Past Medical History:  Diagnosis Date   Diabetes mellitus without complication ([place])     Past Surgical History:  Procedure  Laterality Date   HEMORROIDECTOMY      Family Psychiatric History: see chart  Family History:  Family History  Problem Relation Age of Onset   Dementia Mother    Dementia Maternal Grandfather    Dementia Maternal Grandmother     Social History:  Social History   Socioeconomic History   Marital status: Single    Spouse name: Barista   Number of children: 1   Years of education: 14   Highest education level: Futures trader degree: occupational, Hotel manager, or vocational program  Occupational History   Occupation: Building control surveyor    Comment: Probation officer  Tobacco Use   Smoking status: Former    Types: Cigarettes   Smokeless tobacco: Never  Scientific laboratory technician Use: Every day   Devices: 5%  Substance and Sexual Activity   Alcohol use: Never   Drug use: Yes    Types: Marijuana   Sexual activity: Yes  Other Topics Concern   Not on file  Social History Narrative   Lives in [place] with fiance.    Social Determinants of Health   Financial Resource Strain: Not on file  Food Insecurity: Not on file  Transportation Needs: Not on file  Physical Activity: Not on file  Stress: Not on file  Social Connections: Not on file    Allergies:  Allergies  Allergen Reactions   Diclofenac Hives   Cefdinir    Other     CATAFLAN   Citalopram Rash    Pt reports hives  Lorazepam Rash    Pt reports hives    Metabolic Disorder Labs: No results found for: HGBA1C, MPG No results found for: PROLACTIN No results found for: CHOL, TRIG, HDL, CHOLHDL, VLDL, LDLCALC No results found for: TSH  Therapeutic Level Labs: No results found for: LITHIUM No results found for: VALPROATE No components found for:  CBMZ  Current Medications: Current Outpatient Medications  Medication Sig Dispense Refill   atorvastatin (LIPITOR) 10 MG tablet Take 10 mg by mouth daily.     fenofibrate 160 MG tablet Take 160 mg by mouth daily.     hydrOXYzine (ATARAX) 10 MG tablet Take 10 mg by mouth  daily.     hydrOXYzine (ATARAX) 25 MG tablet Take 1 tablet (25 mg total) by mouth 3 (three) times daily as needed. 30 tablet 0   metFORMIN (GLUCOPHAGE-XR) 500 MG 24 hr tablet Take 500 mg by mouth 2 (two) times daily.     rOPINIRole (REQUIP) 1 MG tablet Take 1 mg by mouth at bedtime.     traZODone (DESYREL) 100 MG tablet Take 100 mg by mouth at bedtime.     TRULICITY [id number] SOPN Inject 0.75 mg into the skin every 14 (fourteen) days.     Vilazodone HCl 20 MG TABS Take 1 tablet (20 mg total) by mouth daily. 30 tablet 1   buPROPion (WELLBUTRIN XL) 150 MG 24 hr tablet Take 150 mg by mouth daily. (Patient not taking: Reported on 11/04/2021)     No current facility-administered medications for this visit.    Medication Side Effects: none  Orders placed this visit:  No orders of the defined types were placed in this encounter.   Psychiatric Specialty Exam:  Review of Systems  Genitourinary:  Positive for frequency.  Musculoskeletal:  Positive for back pain.   There were no vitals taken for this visit.There is no height or weight on file to calculate BMI.  General Appearance: Casual, Neat, and Well Groomed  Eye Contact:  Good  Speech:  Clear and Coherent  Volume:  Normal  Mood:  Anxious, Depressed, and Dysphoric  Affect:  Non-Congruent, Depressed, and Anxious  Thought Process:  Coherent  Orientation:  Full (Time, Place, and Person)  Thought Content: Logical   Suicidal Thoughts:  No  Homicidal Thoughts:  No  Memory:  WNL  Judgement:  Good  Insight:  Good  Psychomotor Activity:  Normal  Concentration:  Concentration: Good  Recall:  Good  Fund of Knowledge: Good  Language: Good  Assets:  Desire for Improvement  ADL's:  Intact  Cognition: WNL  Prognosis:  Good   Screenings:  Flowsheet Row ED from 10/04/2021 in [facility] No Risk       Receiving Psychotherapy: No   Treatment Plan/Recommendations:   Greater  than 50% of  60 face to face time with patient was spent on counseling and coordination of care. We discussed his long hx of anxiety and depression since the age of 66. He has tried many antidepressants over the years and reports no real relief from symptoms. We discussed the MDQ and his self reported mood fluctuations with predominately irritability.  We agreed to  Will start Viibryd 10 mg for 7 days, then 20 mg daily To increase Hydroxyzine to 25 mg three times daily Will report worsening symptoms or side effects promptly To follow up in 4 weeks to reassess Provided emergency contact information Reviewed PDMP  Elwanda Brooklyn, NP

## 2021-12-02 ENCOUNTER — Encounter: Payer: Self-pay | Admitting: Behavioral Health

## 2021-12-02 ENCOUNTER — Ambulatory Visit (INDEPENDENT_AMBULATORY_CARE_PROVIDER_SITE_OTHER): Payer: Commercial Managed Care - PPO | Admitting: Behavioral Health

## 2021-12-02 ENCOUNTER — Other Ambulatory Visit: Payer: Self-pay

## 2021-12-02 DIAGNOSIS — F331 Major depressive disorder, recurrent, moderate: Secondary | ICD-10-CM

## 2021-12-02 DIAGNOSIS — F411 Generalized anxiety disorder: Secondary | ICD-10-CM

## 2021-12-02 DIAGNOSIS — F39 Unspecified mood [affective] disorder: Secondary | ICD-10-CM

## 2021-12-02 MED ORDER — VILAZODONE HCL 40 MG PO TABS: 40.0000 mg | ORAL_TABLET | Freq: Every day | ORAL | 2 refills | Status: DC

## 2021-12-02 MED ORDER — HYDROXYZINE HCL 25 MG PO TABS: 25.0000 mg | ORAL_TABLET | Freq: Three times a day (TID) | ORAL | 3 refills | Status: DC | PRN

## 2021-12-02 NOTE — Progress Notes (Signed)
[facility] ? ?Patient ID: Maxwell Crawford,  ?MRN: [id number] ? ?PCP: Jim Like, NP ? ?Date of Evaluation: 12/02/2021 ?Time spent:30 minutes ? ?Chief Complaint:  ?Chief Complaint   ?Anxiety; Depression; Follow-up; Medication Refill; Insomnia ?  ? ? ?HISTORY/CURRENT STATUS: ?HPI ? ?73 year olf male presents to this office for follow up and to establish care. He reports that he is about 50% improved with anxiety and depression since last visit. He feels that he could benefit from dosage increase. Still struggles with sleep and nothing seems to work. He is requesting another medication to try for for sleep.  Says his irritability and mood fluctuation have improved as well. Says his anxiety level today is 3/10 and depression is 3/10. He does sleep 7 hours per night most of the time. He denies any current mania. No psychosis. No SI/HI.  ?  ?Prior psychiatric medication trial: ?  ?Abilify ?Zoloft ?Citalopram ?Lexapro ?Wellbutrin ?Effexor ?Cymbalta ?Gabapentin ?Hydroxyzine ?Trazodone ? ?Individual Medical History/ Review of Systems: Changes? :No  ? ?Allergies: Diclofenac, Cefdinir, Other, Citalopram, and Lorazepam ? ?Current Medications:  ?Current Outpatient Medications:  ?  Vilazodone HCl (VIIBRYD) 40 MG TABS, Take 1 tablet (40 mg total) by mouth daily., Disp: 30 tablet, Rfl: 2 ?  atorvastatin (LIPITOR) 10 MG tablet, Take 10 mg by mouth daily., Disp: , Rfl:  ?  buPROPion (WELLBUTRIN XL) 150 MG 24 hr tablet, Take 150 mg by mouth daily. (Patient not taking: Reported on 11/04/2021), Disp: , Rfl:  ?  fenofibrate 160 MG tablet, Take 160 mg by mouth daily., Disp: , Rfl:  ?  hydrOXYzine (ATARAX) 10 MG tablet, Take 10 mg by mouth daily., Disp: , Rfl:  ?  hydrOXYzine (ATARAX) 25 MG tablet, Take 1 tablet (25 mg total) by mouth 3 (three) times daily as needed., Disp: 30 tablet, Rfl: 3 ?  metFORMIN (GLUCOPHAGE-XR) 500 MG 24 hr tablet, Take 500 mg by mouth 2 (two) times daily., Disp: , Rfl:  ?  rOPINIRole (REQUIP) 1  MG tablet, Take 1 mg by mouth at bedtime., Disp: , Rfl:  ?  TRULICITY 0.75 MG/0.5ML SOPN, Inject 0.75 mg into the skin every 14 (fourteen) days., Disp: , Rfl:  ?Medication Side Effects: none ? ?Family Medical/ Social History: Changes? No ? ?MENTAL HEALTH EXAM: ? ?There were no vitals taken for this visit.There is no height or weight on file to calculate BMI.  ?General Appearance: Casual and Neat  ?Eye Contact:  Good  ?Speech:  Clear and Coherent  ?Volume:  Normal  ?Mood:  Anxious and Depressed  ?Affect:  Appropriate, Depressed, and Anxious  ?Thought Process:  Coherent  ?Orientation:  Full (Time, Place, and Person)  ?Thought Content: Logical   ?Suicidal Thoughts:  No  ?Homicidal Thoughts:  No  ?Memory:  WNL  ?Judgement:  Good  ?Insight:  Good  ?Psychomotor Activity:  Normal  ?Concentration:  Concentration: Good  ?Recall:  Good  ?Fund of Knowledge: Good  ?Language: Good  ?Assets:  Desire for Improvement  ?ADL's:  Intact  ?Cognition: WNL  ?Prognosis:  Good  ? ? ?DIAGNOSES:  ?  ICD-10-CM   ?1. Generalized anxiety disorder  F41.1 Vilazodone HCl (VIIBRYD) 40 MG TABS  ?  hydrOXYzine (ATARAX) 25 MG tablet  ?  ?2. Major depressive disorder, recurrent episode, moderate (HCC)  F33.1 Vilazodone HCl (VIIBRYD) 40 MG TABS  ?  ?3. Unspecified mood (affective) disorder (HCC)  F39 Vilazodone HCl (VIIBRYD) 40 MG TABS  ?  ? ? ?Receiving Psychotherapy: No  ? ? ?  RECOMMENDATIONS:  ? ?Greater than 50% of  30 min face to face time with patient was spent on counseling and coordination of care. We discussed his modest improvement with anxiety and depression. He is still not having success with medication for sleep and has tried many medications in the past. We discussed the MDQ and his self reported mood fluctuations with predominately irritability. Patient stopped smoking Cannabis after last visit. He was concerned about it increasing anxiety.  ?We reviewed GeneSight results and discussed various medications.  ?We agreed to  ?Increase  Viibryd to 40 mg daily ?Continue Hydroxyzine to 25 mg three times daily ?Will report worsening symptoms or side effects promptly ?To follow up in 6  weeks to reassess ?Provided emergency contact information ?Reviewed PDMP  ? ? ?Joan Flores, NP  ?

## 2022-01-13 ENCOUNTER — Encounter: Payer: Self-pay | Admitting: Behavioral Health

## 2022-01-13 ENCOUNTER — Ambulatory Visit: Payer: Commercial Managed Care - PPO | Admitting: Behavioral Health

## 2022-01-13 DIAGNOSIS — F5105 Insomnia due to other mental disorder: Secondary | ICD-10-CM | POA: Diagnosis not present

## 2022-01-13 DIAGNOSIS — F99 Mental disorder, not otherwise specified: Secondary | ICD-10-CM

## 2022-01-13 DIAGNOSIS — F331 Major depressive disorder, recurrent, moderate: Secondary | ICD-10-CM | POA: Diagnosis not present

## 2022-01-13 DIAGNOSIS — F411 Generalized anxiety disorder: Secondary | ICD-10-CM

## 2022-01-13 DIAGNOSIS — F39 Unspecified mood [affective] disorder: Secondary | ICD-10-CM

## 2022-01-13 DIAGNOSIS — R454 Irritability and anger: Secondary | ICD-10-CM

## 2022-01-13 MED ORDER — BUPROPION HCL ER (XL) 150 MG PO TB24: 150.0000 mg | ORAL_TABLET | Freq: Every day | ORAL | 1 refills | Status: DC

## 2022-01-13 MED ORDER — VILAZODONE HCL 40 MG PO TABS: 40.0000 mg | ORAL_TABLET | Freq: Every day | ORAL | 2 refills | Status: DC

## 2022-01-13 MED ORDER — TRAZODONE HCL 150 MG PO TABS: 150.0000 mg | ORAL_TABLET | Freq: Every day | ORAL | 1 refills | Status: DC

## 2022-01-13 MED ORDER — LAMOTRIGINE 25 MG PO TABS: ORAL_TABLET | ORAL | 1 refills | Status: DC

## 2022-01-13 MED ORDER — HYDROXYZINE HCL 25 MG PO TABS: 25.0000 mg | ORAL_TABLET | Freq: Three times a day (TID) | ORAL | 1 refills | Status: DC | PRN

## 2022-01-13 NOTE — Progress Notes (Signed)
[facility] ? ?Patient ID: Maxwell Crawford,  ?MRN: [id number] ? ?PCP: Jim Like, NP ? ?Date of Evaluation: 01/13/2022 ?Time spent:30 minutes ? ?Chief Complaint:  ?Chief Complaint   ?Anxiety; Depression; Medication Refill; Follow-up; Irritability ?  ? ? ?HISTORY/CURRENT STATUS: ?HPI ? ?60 year olf male presents to this office for follow up and to establish care. He reports that he is about 75 %  improved with anxiety and depression since last visit.  Says his irritability and mood fluctuation have improved overall but still "feeling hot headed at times". Requesting medication. Says his anxiety level today is 2/10 and depression is 2/10. He does sleep 7 hours per night most of the time. He denies any current mania. No psychosis. No SI/HI.  ?  ?Prior psychiatric medication trial: ?  ?Abilify ?Zoloft ?Citalopram ?Lexapro ?Wellbutrin ?Effexor ?Cymbalta ?Gabapentin ?Hydroxyzine ?Trazodone ? ? ?Individual Medical History/ Review of Systems: Changes? :No  ? ?Allergies: Diclofenac, Cefdinir, Other, Citalopram, and Lorazepam ? ?Current Medications:  ?Current Outpatient Medications:  ?  lamoTRIgine (LAMICTAL) 25 MG tablet, Take one tablet 25 mg daily for 14 days, then two tablets 50 mg total daily., Disp: 60 tablet, Rfl: 1 ?  traZODone (DESYREL) 150 MG tablet, Take 1 tablet (150 mg total) by mouth at bedtime., Disp: 90 tablet, Rfl: 1 ?  atorvastatin (LIPITOR) 10 MG tablet, Take 10 mg by mouth daily., Disp: , Rfl:  ?  buPROPion (WELLBUTRIN XL) 150 MG 24 hr tablet, Take 1 tablet (150 mg total) by mouth daily., Disp: 90 tablet, Rfl: 1 ?  fenofibrate 160 MG tablet, Take 160 mg by mouth daily., Disp: , Rfl:  ?  hydrOXYzine (ATARAX) 10 MG tablet, Take 10 mg by mouth daily., Disp: , Rfl:  ?  hydrOXYzine (ATARAX) 25 MG tablet, Take 1 tablet (25 mg total) by mouth 3 (three) times daily as needed., Disp: 90 tablet, Rfl: 1 ?  metFORMIN (GLUCOPHAGE-XR) 500 MG 24 hr tablet, Take 500 mg by mouth 2 (two) times daily.,  Disp: , Rfl:  ?  rOPINIRole (REQUIP) 1 MG tablet, Take 1 mg by mouth at bedtime., Disp: , Rfl:  ?  TRULICITY 0.75 MG/0.5ML SOPN, Inject 0.75 mg into the skin every 14 (fourteen) days., Disp: , Rfl:  ?  Vilazodone HCl (VIIBRYD) 40 MG TABS, Take 1 tablet (40 mg total) by mouth daily., Disp: 90 tablet, Rfl: 2 ?Medication Side Effects: none ? ?Family Medical/ Social History: Changes? No ? ?MENTAL HEALTH EXAM: ? ?There were no vitals taken for this visit.There is no height or weight on file to calculate BMI.  ?General Appearance: Casual and Neat  ?Eye Contact:  Good  ?Speech:  Clear and Coherent  ?Volume:  Normal  ?Mood:  NA  ?Affect:  Appropriate  ?Thought Process:  Coherent  ?Orientation:  Full (Time, Place, and Person)  ?Thought Content: Logical   ?Suicidal Thoughts:  No  ?Homicidal Thoughts:  No  ?Memory:  WNL  ?Judgement:  Good  ?Insight:  Good  ?Psychomotor Activity:  Normal  ?Concentration:  Concentration: Good  ?Recall:  Good  ?Fund of Knowledge: Good  ?Language: Good  ?Assets:  Desire for Improvement  ?ADL's:  Intact  ?Cognition: WNL  ?Prognosis:  Good  ? ? ?DIAGNOSES:  ?  ICD-10-CM   ?1. Generalized anxiety disorder  F41.1 Vilazodone HCl (VIIBRYD) 40 MG TABS  ?  hydrOXYzine (ATARAX) 25 MG tablet  ?  buPROPion (WELLBUTRIN XL) 150 MG 24 hr tablet  ?  ?2. Major depressive disorder, recurrent  episode, moderate (HCC)  F33.1 Vilazodone HCl (VIIBRYD) 40 MG TABS  ?  buPROPion (WELLBUTRIN XL) 150 MG 24 hr tablet  ?  ?3. Unspecified mood (affective) disorder (HCC)  F39 Vilazodone HCl (VIIBRYD) 40 MG TABS  ?  buPROPion (WELLBUTRIN XL) 150 MG 24 hr tablet  ?  lamoTRIgine (LAMICTAL) 25 MG tablet  ?  ?4. Insomnia due to other mental disorder  F51.05 traZODone (DESYREL) 150 MG tablet  ? F99   ?  ?5. Irritability  R45.4 lamoTRIgine (LAMICTAL) 25 MG tablet  ?  ? ? ?Receiving Psychotherapy: No  ? ? ?RECOMMENDATIONS:  ? ?Greater than 50% of  30 min face to face time with patient was spent on counseling and coordination of care.   We discussed his modest improvement with anxiety and depression. He is still not having success with medication for sleep and has tried many medications in the past. Still having some irritability on regular basis. Patient still refraining from smoking Cannabis.Marland Kitchen He was concerned about it increasing anxiety.  ?  ?We agreed to: ?Continue Viibryd to 40 mg daily ?Continue Trazodone 150 mg daily at bedtime ?Continue Wellbutrin 150 mg XL daily  ?Continue Hydroxyzine to 25 mg three times daily ?To start Lamictal 25 mg for 14 days, then 50 mg daily ?Will report worsening symptoms or side effects promptly ?To follow up in 6  weeks to reassess ?Provided emergency contact information ?Reviewed PDMP  ? ? ? ? ? ?Joan Flores, NP  ?

## 2022-03-03 ENCOUNTER — Ambulatory Visit (INDEPENDENT_AMBULATORY_CARE_PROVIDER_SITE_OTHER): Payer: Commercial Managed Care - PPO | Admitting: Behavioral Health

## 2022-03-03 ENCOUNTER — Encounter: Payer: Self-pay | Admitting: Behavioral Health

## 2022-03-03 DIAGNOSIS — F331 Major depressive disorder, recurrent, moderate: Secondary | ICD-10-CM

## 2022-03-03 DIAGNOSIS — R454 Irritability and anger: Secondary | ICD-10-CM

## 2022-03-03 DIAGNOSIS — F411 Generalized anxiety disorder: Secondary | ICD-10-CM | POA: Diagnosis not present

## 2022-03-03 DIAGNOSIS — F5105 Insomnia due to other mental disorder: Secondary | ICD-10-CM | POA: Diagnosis not present

## 2022-03-03 DIAGNOSIS — F39 Unspecified mood [affective] disorder: Secondary | ICD-10-CM

## 2022-03-03 DIAGNOSIS — F99 Mental disorder, not otherwise specified: Secondary | ICD-10-CM

## 2022-03-03 NOTE — Progress Notes (Signed)
[facility]  Patient ID: Maxwell Crawford,  MRN: [id number]  PCP: Jim Like, NP  Date of Evaluation: 03/03/2022 Time spent:30 minutes  Chief Complaint:  Chief Complaint   Anxiety; Depression; Follow-up; Medication Refill; Medication Problem     HISTORY/CURRENT STATUS: HPI  80 year olf male presents to this office for follow up and to establish care. He reports that he is about 75 %  improved with anxiety and depression since last visit.  Says his irritability and mood fluctuation have improved overall. Says he was not able to take the Lamictal. Says it made him very sleepy. He did not want to try at bedtime.  Says his anxiety level today is 2/10 and depression is 2/10. He does sleep 7 hours per night most of the time. He denies any current mania. No psychosis. No SI/HI.    Prior psychiatric medication trial:   Abilify Zoloft Citalopram Lexapro Wellbutrin Effexor Cymbalta Gabapentin Hydroxyzine Trazodone      Individual Medical History/ Review of Systems: Changes? :No   Allergies: Diclofenac, Cefdinir, Other, Citalopram, and Lorazepam  Current Medications:  Current Outpatient Medications:    atorvastatin (LIPITOR) 10 MG tablet, Take 10 mg by mouth daily., Disp: , Rfl:    buPROPion (WELLBUTRIN XL) 150 MG 24 hr tablet, Take 1 tablet (150 mg total) by mouth daily., Disp: 90 tablet, Rfl: 1   fenofibrate 160 MG tablet, Take 160 mg by mouth daily., Disp: , Rfl:    hydrOXYzine (ATARAX) 10 MG tablet, Take 10 mg by mouth daily., Disp: , Rfl:    hydrOXYzine (ATARAX) 25 MG tablet, Take 1 tablet (25 mg total) by mouth 3 (three) times daily as needed., Disp: 90 tablet, Rfl: 1   lamoTRIgine (LAMICTAL) 25 MG tablet, Take one tablet 25 mg daily for 14 days, then two tablets 50 mg total daily., Disp: 60 tablet, Rfl: 1   metFORMIN (GLUCOPHAGE-XR) 500 MG 24 hr tablet, Take 500 mg by mouth 2 (two) times daily., Disp: , Rfl:    rOPINIRole (REQUIP) 1 MG tablet, Take 1  mg by mouth at bedtime., Disp: , Rfl:    traZODone (DESYREL) 150 MG tablet, Take 1 tablet (150 mg total) by mouth at bedtime., Disp: 90 tablet, Rfl: 1   TRULICITY 0.75 MG/0.5ML SOPN, Inject 0.75 mg into the skin every 14 (fourteen) days., Disp: , Rfl:    Vilazodone HCl (VIIBRYD) 40 MG TABS, Take 1 tablet (40 mg total) by mouth daily., Disp: 90 tablet, Rfl: 2 Medication Side Effects: none  Family Medical/ Social History: Changes? No   MENTAL HEALTH EXAM:  There were no vitals taken for this visit.There is no height or weight on file to calculate BMI.  General Appearance: Casual and Neat  Eye Contact:  Good  Speech:  Clear and Coherent  Volume:  Normal  Mood:  NA  Affect:  Appropriate  Thought Process:  Coherent  Orientation:  Full (Time, Place, and Person)  Thought Content: Logical   Suicidal Thoughts:  No  Homicidal Thoughts:  No  Memory:  WNL  Judgement:  Good  Insight:  Good  Psychomotor Activity:  Normal  Concentration:  Concentration: Good  Recall:  Good  Fund of Knowledge: Good  Language: Good  Assets:  Desire for Improvement  ADL's:  Intact  Cognition: WNL  Prognosis:  Good    DIAGNOSES: No diagnosis found.  Receiving Psychotherapy: No    RECOMMENDATIONS:   Greater than 50% of  30 min face to face time with  patient was spent on counseling and coordination of care.  We discussed his significant improvement with anxiety and depression. He is still not having success with medication for sleep and has tried many medications in the past.  Patient still refraining from smoking Cannabis. He was concerned about it increasing anxiety.    We agreed to: Continue Viibryd to 40 mg daily Continue Trazodone 150 mg daily at bedtime Continue Wellbutrin 150 mg XL daily  Continue Hydroxyzine to 25 mg three times daily Stopped Lamictal. Extreme fatigue Will report worsening symptoms or side effects promptly To follow up in 3 months to reassess Provided emergency contact  information Reviewed PDMP        Joan Flores, NP

## 2022-04-04 ENCOUNTER — Other Ambulatory Visit: Payer: Self-pay | Admitting: Behavioral Health

## 2022-04-04 DIAGNOSIS — F411 Generalized anxiety disorder: Secondary | ICD-10-CM

## 2022-06-02 ENCOUNTER — Ambulatory Visit: Payer: Commercial Managed Care - PPO | Admitting: Behavioral Health

## 2022-06-05 ENCOUNTER — Other Ambulatory Visit: Payer: Self-pay | Admitting: Behavioral Health

## 2022-06-05 DIAGNOSIS — F411 Generalized anxiety disorder: Secondary | ICD-10-CM

## 2022-06-05 DIAGNOSIS — F5105 Insomnia due to other mental disorder: Secondary | ICD-10-CM

## 2022-06-05 DIAGNOSIS — F39 Unspecified mood [affective] disorder: Secondary | ICD-10-CM

## 2022-06-05 DIAGNOSIS — F331 Major depressive disorder, recurrent, moderate: Secondary | ICD-10-CM

## 2022-06-29 ENCOUNTER — Other Ambulatory Visit: Payer: Self-pay | Admitting: Behavioral Health

## 2022-06-29 DIAGNOSIS — F411 Generalized anxiety disorder: Secondary | ICD-10-CM

## 2022-06-30 ENCOUNTER — Ambulatory Visit: Payer: Commercial Managed Care - PPO | Admitting: Behavioral Health

## 2022-07-07 ENCOUNTER — Telehealth (INDEPENDENT_AMBULATORY_CARE_PROVIDER_SITE_OTHER): Payer: Commercial Managed Care - PPO | Admitting: Behavioral Health

## 2022-07-07 ENCOUNTER — Encounter: Payer: Self-pay | Admitting: Behavioral Health

## 2022-07-07 DIAGNOSIS — F39 Unspecified mood [affective] disorder: Secondary | ICD-10-CM

## 2022-07-07 DIAGNOSIS — R454 Irritability and anger: Secondary | ICD-10-CM | POA: Diagnosis not present

## 2022-07-07 MED ORDER — LAMOTRIGINE 25 MG PO TABS: ORAL_TABLET | ORAL | 1 refills | Status: DC

## 2022-07-07 NOTE — Progress Notes (Signed)
Maxwell Crawford [id number] 1986-02-19 36 y.o.  Virtual Visit via Video Note  I connected with pt @ on 07/07/22 at 11:30 AM EDT by a video enabled telemedicine application and verified that I am speaking with the correct person using two identifiers.   I discussed the limitations of evaluation and management by telemedicine and the availability of in person appointments. The patient expressed understanding and agreed to proceed.  I discussed the assessment and treatment plan with the patient. The patient was provided an opportunity to ask questions and all were answered. The patient agreed with the plan and demonstrated an understanding of the instructions.   The patient was advised to call back or seek an in-person evaluation if the symptoms worsen or if the condition fails to improve as anticipated.  I provided 20 minutes of non-face-to-face time during this encounter.  The patient was located at home.  The provider was located at [facility].   Joan Flores, NP   Subjective:   Patient ID:  Maxwell Crawford is a 36 y.o. (DOB [birth year]) male.  Chief Complaint:  Chief Complaint  Patient presents with   Depression   Anxiety   Follow-up   Medication Refill   Medication Problem   Patient Education    HPI  36 year old male presents to this office via video visit for follow up and to establish care. Says that he continues to have some irritability and anger issues. He reports that he is about 75 %  improved with anxiety and depression since last visit.  Says his irritability and mood fluctuation have improved overall. He would like to retry Lamictal at bedtime.Take that  Says his anxiety level today is 2/10 and depression is 2/10. He does sleep 7 hours per night most of the time. He denies any current mania. No psychosis. No SI/HI.    Prior psychiatric medication trial:    Abilify Zoloft Citalopram Lexapro Wellbutrin Effexor Cymbalta Gabapentin Hydroxyzine Trazodone    Review of Systems:  Review of Systems  Constitutional: Negative.   Allergic/Immunologic: Negative.   Neurological: Negative.   Psychiatric/Behavioral:  The patient is nervous/anxious.     Medications: I have reviewed the patient's current medications.  Current Outpatient Medications  Medication Sig Dispense Refill   atorvastatin (LIPITOR) 10 MG tablet Take 10 mg by mouth daily.     buPROPion (WELLBUTRIN XL) 150 MG 24 hr tablet TAKE 1 TABLET BY MOUTH DAILY 90 tablet 0   fenofibrate 160 MG tablet Take 160 mg by mouth daily.     hydrOXYzine (ATARAX) 25 MG tablet TAKE 1 TABLET BY MOUTH 3 TIMES  DAILY AS NEEDED 270 tablet 0   lamoTRIgine (LAMICTAL) 25 MG tablet Take one tablet 25 mg daily for 14 days, then two tablets 50 mg total daily. 60 tablet 1   metFORMIN (GLUCOPHAGE-XR) 500 MG 24 hr tablet Take 500 mg by mouth 2 (two) times daily.     rOPINIRole (REQUIP) 1 MG tablet Take 1 mg by mouth at bedtime.     traZODone (DESYREL) 150 MG tablet TAKE 1 TABLET BY MOUTH AT  BEDTIME 90 tablet 0   TRULICITY 0.75 MG/0.5ML SOPN Inject 0.75 mg into the skin every 14 (fourteen) days.     Vilazodone HCl (VIIBRYD) 40 MG TABS Take 1 tablet (40 mg total) by mouth daily. 90 tablet 2   No current facility-administered medications for this visit.    Medication Side Effects: None  Allergies:  Allergies  Allergen Reactions  Diclofenac Hives   Cefdinir    Other     CATAFLAN   Citalopram Rash    Pt reports hives   Lorazepam Rash    Pt reports hives    Past Medical History:  Diagnosis Date   Diabetes mellitus without complication ([place])     Family History  Problem Relation Age of Onset   Dementia Mother    Dementia Maternal Grandfather    Dementia Maternal Grandmother     Social History   Socioeconomic History   Marital status: Married    Spouse name: Aldona Bar   Number of  children: 1   Years of education: 14   Highest education level: Futures trader degree: occupational, Hotel manager, or vocational program  Occupational History   Occupation: Building control surveyor    Comment: Probation officer  Tobacco Use   Smoking status: Former    Types: Cigarettes   Smokeless tobacco: Never  Scientific laboratory technician Use: Every day   Devices: 5%  Substance and Sexual Activity   Alcohol use: Never   Drug use: Yes    Types: Marijuana   Sexual activity: Yes  Other Topics Concern   Not on file  Social History Narrative   Lives in [place] with fiance.    Social Determinants of Health   Financial Resource Strain: Not on file  Food Insecurity: Not on file  Transportation Needs: Not on file  Physical Activity: Not on file  Stress: Not on file  Social Connections: Not on file  Intimate Partner Violence: Not on file    Past Medical History, Surgical history, Social history, and Family history were reviewed and updated as appropriate.   Please see review of systems for further details on the patient's review from today.   Objective:   Physical Exam:  There were no vitals taken for this visit.  Physical Exam Constitutional:      General: He is not in acute distress.    Appearance: Normal appearance.  Neurological:     Mental Status: He is alert and oriented to person, place, and time.     Gait: Gait normal.  Psychiatric:        Attention and Perception: Attention and perception normal. He does not perceive auditory or visual hallucinations.        Mood and Affect: Mood and affect normal. Mood is not anxious or depressed. Affect is not labile.        Speech: Speech normal.        Behavior: Behavior normal. Behavior is cooperative.        Thought Content: Thought content normal.        Cognition and Memory: Cognition and memory normal.        Judgment: Judgment normal.     Lab Review:     Component Value Date/Time   NA 137 10/04/2021 1651   K 3.9 10/04/2021 1651    CL 108 10/04/2021 1651   CO2 21 (L) 10/04/2021 1651   GLUCOSE 91 10/04/2021 1651   BUN 17 10/04/2021 1651   CREATININE 1.38 (H) 10/04/2021 1651   CALCIUM 9.3 10/04/2021 1651   PROT 7.6 10/04/2021 1651   ALBUMIN 4.5 10/04/2021 1651   AST 34 10/04/2021 1651   ALT 38 10/04/2021 1651   ALKPHOS 30 (L) 10/04/2021 1651   BILITOT 0.4 10/04/2021 1651   GFRNONAA >60 10/04/2021 1651       Component Value Date/Time   WBC 11.5 (H) 10/04/2021 1651   RBC 5.11 10/04/2021 1651  HGB 14.7 10/04/2021 1651   HCT 44.0 10/04/2021 1651   PLT 423 (H) 10/04/2021 1651   MCV 86.1 10/04/2021 1651   MCH 28.8 10/04/2021 1651   MCHC 33.4 10/04/2021 1651   RDW 12.9 10/04/2021 1651    No results found for: "POCLITH", "LITHIUM"   No results found for: "PHENYTOIN", "PHENOBARB", "VALPROATE", "CBMZ"   .res Assessment: Plan:    Recommendations:  Greater than 50% of  30 min video visit time with patient was spent on counseling and coordination of care.  We discussed his significant improvement with anxiety and depression but continues to have irritability. His sleeping has improved.  Patient still refraining from smoking Cannabis. He was concerned about it increasing anxiety.    We agreed to: Continue Viibryd to 40 mg daily Continue Trazodone 150 mg daily at bedtime Continue Wellbutrin 150 mg XL daily  Continue Hydroxyzine to 25 mg three times daily Restart Lamictal 25 mg for 14 days, then 50 mg daily at bedtime.  Will report worsening symptoms or side effects promptly To follow up in 4 weeks to reassess Provided emergency contact information Reviewed PDMP       [name] A. Shenice Dolder  Maxwell Crawford was seen today for depression, anxiety, follow-up, medication refill, medication problem and patient education.  Diagnoses and all orders for this visit:  Unspecified mood (affective) disorder (HCC) -     lamoTRIgine (LAMICTAL) 25 MG tablet; Take one tablet 25 mg daily for 14 days, then two tablets 50 mg total  daily.  Irritability -     lamoTRIgine (LAMICTAL) 25 MG tablet; Take one tablet 25 mg daily for 14 days, then two tablets 50 mg total daily.     Please see After Visit Summary for patient specific instructions.  No future appointments.  No orders of the defined types were placed in this encounter.     -------------------------------

## 2022-08-11 ENCOUNTER — Telehealth: Payer: Commercial Managed Care - PPO | Admitting: Behavioral Health

## 2022-08-30 ENCOUNTER — Other Ambulatory Visit: Payer: Self-pay | Admitting: Behavioral Health

## 2022-08-30 DIAGNOSIS — F39 Unspecified mood [affective] disorder: Secondary | ICD-10-CM

## 2022-08-30 DIAGNOSIS — F5105 Insomnia due to other mental disorder: Secondary | ICD-10-CM

## 2022-08-30 DIAGNOSIS — F331 Major depressive disorder, recurrent, moderate: Secondary | ICD-10-CM

## 2022-08-30 DIAGNOSIS — F411 Generalized anxiety disorder: Secondary | ICD-10-CM

## 2022-08-30 NOTE — Telephone Encounter (Signed)
Please call to schedule an appt  

## 2022-08-31 NOTE — Telephone Encounter (Signed)
Pt is scheduled 12/8

## 2022-09-08 ENCOUNTER — Encounter: Payer: Self-pay | Admitting: Behavioral Health

## 2022-09-08 ENCOUNTER — Telehealth (INDEPENDENT_AMBULATORY_CARE_PROVIDER_SITE_OTHER): Payer: Commercial Managed Care - PPO | Admitting: Behavioral Health

## 2022-09-08 DIAGNOSIS — R454 Irritability and anger: Secondary | ICD-10-CM | POA: Diagnosis not present

## 2022-09-08 DIAGNOSIS — F411 Generalized anxiety disorder: Secondary | ICD-10-CM | POA: Diagnosis not present

## 2022-09-08 DIAGNOSIS — F331 Major depressive disorder, recurrent, moderate: Secondary | ICD-10-CM

## 2022-09-08 DIAGNOSIS — F39 Unspecified mood [affective] disorder: Secondary | ICD-10-CM

## 2022-09-08 DIAGNOSIS — F5105 Insomnia due to other mental disorder: Secondary | ICD-10-CM

## 2022-09-08 DIAGNOSIS — F99 Mental disorder, not otherwise specified: Secondary | ICD-10-CM

## 2022-09-08 MED ORDER — HYDROXYZINE HCL 50 MG PO TABS: 50.0000 mg | ORAL_TABLET | Freq: Three times a day (TID) | ORAL | 2 refills | Status: AC | PRN

## 2022-09-08 MED ORDER — LAMOTRIGINE 100 MG PO TABS: 100.0000 mg | ORAL_TABLET | Freq: Every day | ORAL | 2 refills | Status: AC

## 2022-09-08 MED ORDER — TRAZODONE HCL 150 MG PO TABS: 150.0000 mg | ORAL_TABLET | Freq: Every day | ORAL | 3 refills | Status: DC

## 2022-09-08 MED ORDER — BUPROPION HCL ER (XL) 150 MG PO TB24: 150.0000 mg | ORAL_TABLET | Freq: Every day | ORAL | 1 refills | Status: DC

## 2022-09-08 NOTE — Progress Notes (Signed)
Maxwell Crawford [id number] 1986/07/18 36 y.o.  Virtual Visit via Video Note  I connected with pt @ on 09/08/22 at 11:00 AM EST by a video enabled telemedicine application and verified that I am speaking with the correct person using two identifiers.   I discussed the limitations of evaluation and management by telemedicine and the availability of in person appointments. The patient expressed understanding and agreed to proceed.  I discussed the assessment and treatment plan with the patient. The patient was provided an opportunity to ask questions and all were answered. The patient agreed with the plan and demonstrated an understanding of the instructions.   The patient was advised to call back or seek an in-person evaluation if the symptoms worsen or if the condition fails to improve as anticipated.  I provided 30 minutes of non-face-to-face time during this encounter.  The patient was located at home.  The provider was located at [facility].   Joan Flores, NP   Subjective:   Patient ID:  Maxwell Crawford is a 36 y.o. (DOB 07-14-1986) male.  Chief Complaint:  Chief Complaint  Patient presents with   Anxiety   Depression   Medication Problem   Patient Education   Medication Refill   Follow-up   Irritability    HPI  36 year old male presents to this office via video visit for follow up and to establish care. Says that he continues to have some irritability and anger issues. He reports that he is about 75 %  improved with anxiety and depression since last visit.  Says his irritability and mood fluctuation have improved overall. He is requesting dosage increase of the Lamictal to see if it will improve his continued problems with irritability and short fuse. Take that  Says his anxiety level today is 2/10 and depression is 2/10. He does sleep 7 hours per night most of the time. He denies any current mania. No psychosis. No SI/HI.    Prior psychiatric  medication trial:   Abilify Zoloft Citalopram Lexapro Wellbutrin Effexor Cymbalta Gabapentin Hydroxyzine Trazodone  Review of Systems:  Review of Systems  Constitutional: Negative.   Allergic/Immunologic: Negative.   Neurological: Negative.   Psychiatric/Behavioral: Negative.      Medications: I have reviewed the patient's current medications.  Current Outpatient Medications  Medication Sig Dispense Refill   atorvastatin (LIPITOR) 10 MG tablet Take 10 mg by mouth daily.     buPROPion (WELLBUTRIN XL) 150 MG 24 hr tablet TAKE 1 TABLET BY MOUTH DAILY 90 tablet 0   fenofibrate 160 MG tablet Take 160 mg by mouth daily.     hydrOXYzine (ATARAX) 25 MG tablet TAKE 1 TABLET BY MOUTH 3 TIMES  DAILY AS NEEDED 270 tablet 0   metFORMIN (GLUCOPHAGE-XR) 500 MG 24 hr tablet Take 500 mg by mouth 2 (two) times daily.     rOPINIRole (REQUIP) 1 MG tablet Take 1 mg by mouth at bedtime.     traZODone (DESYREL) 150 MG tablet TAKE 1 TABLET BY MOUTH AT  BEDTIME 90 tablet 3   TRULICITY 0.75 MG/0.5ML SOPN Inject 0.75 mg into the skin every 14 (fourteen) days.     Vilazodone HCl (VIIBRYD) 40 MG TABS TAKE 1 TABLET BY MOUTH DAILY 90 tablet 3   No current facility-administered medications for this visit.    Medication Side Effects: None  Allergies:  Allergies  Allergen Reactions   Diclofenac Hives   Cefdinir    Other     CATAFLAN  Citalopram Rash    Pt reports hives   Lorazepam Rash    Pt reports hives    Past Medical History:  Diagnosis Date   Diabetes mellitus without complication (HCC)     Family History  Problem Relation Age of Onset   Dementia Mother    Dementia Maternal Grandfather    Dementia Maternal Grandmother     Social History   Socioeconomic History   Marital status: Married    Spouse name: Lelon Mast   Number of children: 1   Years of education: 14   Highest education level: Tax adviser degree: occupational, Scientist, product/process development, or vocational program  Occupational History    Occupation: Psychologist, 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    Social Determinants of Health   Financial Resource Strain: Not on file  Food Insecurity: Not on file  Transportation Needs: Not on file  Physical Activity: Not on file  Stress: Not on file  Social Connections: Not on file  Intimate Partner Violence: Not on file    Past Medical History, Surgical history, Social history, and Family history were reviewed and updated as appropriate.   Please see review of systems for further details on the patient's review from today.   Objective:   Physical Exam:  There were no vitals taken for this visit.  Physical Exam Constitutional:      General: He is not in acute distress.    Appearance: Normal appearance.  Neurological:     Mental Status: He is alert and oriented to person, place, and time.     Gait: Gait normal.  Psychiatric:        Attention and Perception: Attention and perception normal. He does not perceive auditory or visual hallucinations.        Mood and Affect: Mood and affect normal. Mood is not anxious or depressed. Affect is not labile.        Speech: Speech normal.        Behavior: Behavior normal. Behavior is cooperative.        Thought Content: Thought content normal.        Cognition and Memory: Cognition and memory normal.        Judgment: Judgment normal.     Lab Review:     Component Value Date/Time   NA 137 10/04/2021 1651   K 3.9 10/04/2021 1651   CL 108 10/04/2021 1651   CO2 21 (L) 10/04/2021 1651   GLUCOSE 91 10/04/2021 1651   BUN 17 10/04/2021 1651   CREATININE 1.38 (H) 10/04/2021 1651    CALCIUM 9.3 10/04/2021 1651   PROT 7.6 10/04/2021 1651   ALBUMIN 4.5 10/04/2021 1651   AST 34 10/04/2021 1651   ALT 38 10/04/2021 1651   ALKPHOS 30 (L) 10/04/2021 1651   BILITOT 0.4 10/04/2021 1651   GFRNONAA >60 10/04/2021 1651       Component Value Date/Time   WBC 11.5 (H) 10/04/2021 1651   RBC 5.11 10/04/2021 1651   HGB 14.7 10/04/2021 1651   HCT 44.0 10/04/2021 1651   PLT 423 (  H) 10/04/2021 1651   MCV 86.1 10/04/2021 1651   MCH 28.8 10/04/2021 1651   MCHC 33.4 10/04/2021 1651   RDW 12.9 10/04/2021 1651    No results found for: "POCLITH", "LITHIUM"   No results found for: "PHENYTOIN", "PHENOBARB", "VALPROATE", "CBMZ"   .res Assessment: Plan:    Greater than 50% of  30 min video visit time with patient was spent on counseling and coordination of care.  We discussed his significant improvement with anxiety and depression but continues to have irritability and anger. His sleeping has improved.  Patient still refraining from smoking Cannabis. He was concerned about it increasing anxiety.    We agreed to: Continue Viibryd to 40 mg daily Continue Trazodone 150 mg daily at bedtime Continue Wellbutrin 150 mg XL daily  Continue Hydroxyzine to 25 mg three times daily Increase Lamictal to 100 mg daily at bedtime.  Will report worsening symptoms or side effects promptly To follow up in 8 weeks to reassess Provided emergency contact information Reviewed PDMP     Dylann was seen today for anxiety, depression, medication problem, patient education, medication refill, follow-up and irritability.  Diagnoses and all orders for this visit:  Irritability  Unspecified mood (affective) disorder (HCC)  Generalized anxiety disorder  Insomnia due to other mental disorder  Major depressive disorder, recurrent episode, moderate (HCC)     Please see After Visit Summary for patient specific instructions.  No future appointments.  No orders of the defined types were placed in  this encounter.     -------------------------------

## 2022-11-03 ENCOUNTER — Ambulatory Visit: Payer: Commercial Managed Care - PPO | Admitting: Behavioral Health

## 2023-01-05 ENCOUNTER — Ambulatory Visit: Payer: Commercial Managed Care - PPO | Admitting: Behavioral Health

## 2023-01-12 ENCOUNTER — Encounter: Payer: Self-pay | Admitting: Behavioral Health

## 2023-01-12 ENCOUNTER — Ambulatory Visit (INDEPENDENT_AMBULATORY_CARE_PROVIDER_SITE_OTHER): Payer: Commercial Managed Care - PPO | Admitting: Behavioral Health

## 2023-01-12 DIAGNOSIS — F39 Unspecified mood [affective] disorder: Secondary | ICD-10-CM

## 2023-01-12 DIAGNOSIS — F331 Major depressive disorder, recurrent, moderate: Secondary | ICD-10-CM | POA: Diagnosis not present

## 2023-01-12 DIAGNOSIS — F5105 Insomnia due to other mental disorder: Secondary | ICD-10-CM

## 2023-01-12 DIAGNOSIS — F411 Generalized anxiety disorder: Secondary | ICD-10-CM

## 2023-01-12 DIAGNOSIS — R454 Irritability and anger: Secondary | ICD-10-CM

## 2023-01-12 DIAGNOSIS — F99 Mental disorder, not otherwise specified: Secondary | ICD-10-CM

## 2023-01-12 MED ORDER — VILAZODONE HCL 40 MG PO TABS: 40.0000 mg | ORAL_TABLET | Freq: Every day | ORAL | 3 refills | Status: AC

## 2023-01-12 MED ORDER — LAMOTRIGINE 100 MG PO TABS: 100.0000 mg | ORAL_TABLET | Freq: Every day | ORAL | 1 refills | Status: DC

## 2023-01-12 MED ORDER — CARIPRAZINE HCL 1.5 MG PO CAPS: 1.5000 mg | ORAL_CAPSULE | Freq: Every day | ORAL | 1 refills | Status: DC

## 2023-01-12 MED ORDER — HYDROXYZINE HCL 50 MG PO TABS: 50.0000 mg | ORAL_TABLET | Freq: Three times a day (TID) | ORAL | 0 refills | Status: DC | PRN

## 2023-01-12 MED ORDER — BUPROPION HCL ER (XL) 150 MG PO TB24: 150.0000 mg | ORAL_TABLET | Freq: Every day | ORAL | 1 refills | Status: AC

## 2023-01-12 MED ORDER — TRAZODONE HCL 150 MG PO TABS: 150.0000 mg | ORAL_TABLET | Freq: Every day | ORAL | 3 refills | Status: AC

## 2023-01-12 NOTE — Progress Notes (Signed)
[facility]  Patient ID: Maxwell Crawford,  MRN: [id number]  PCP: Jim Like, NP  Date of Evaluation: 01/12/2023 Time spent:40 minutes  Chief Complaint:  Chief Complaint   Anxiety; Depression; Follow-up; Medication Refill; Patient Education; Medication Problem; Family Problem; Drug Problem     HISTORY/CURRENT STATUS: HPI   37 year old male presents to this office for follow up and to establish care. Pt is red faced and appears distressed. He says that his irritability is severe and that he cannot take much more increased stress. Says his anxiety and depression are "through the roof". Says his wife is not working and struggling with her own mental illness. 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 He is requesting changes to his medication to help.  Says he does feel safe and there has been no physical violence in the home. Says that he has no desire to harm self or others. Take that  Says his anxiety level today is 10/10 and depression is 7/10. He does sleep 7 hours per night most of the time. He denies any current mania. No psychosis. No SI/HI.    Prior psychiatric medication trial:   Abilify Zoloft Citalopram Lexapro Wellbutrin Effexor Cymbalta Gabapentin Hydroxyzine Trazodone       Individual Medical History/ Review of Systems: Changes? :No   Allergies: Diclofenac, Cefdinir, Other, Citalopram, and Lorazepam  Current Medications:  Current Outpatient Medications:    cariprazine (VRAYLAR) 1.5 MG capsule, Take 1 capsule (1.5 mg total) by mouth daily., Disp: 30 capsule, Rfl: 1   hydrOXYzine (ATARAX) 50 MG tablet, Take 1 tablet (50 mg total) by mouth 3 (three) times daily as needed., Disp: 30 tablet, Rfl: 0   lamoTRIgine (LAMICTAL) 100 MG tablet, Take 1 tablet (100 mg total) by mouth daily., Disp: 90 tablet, Rfl: 1   Vilazodone HCl (VIIBRYD) 40 MG TABS, Take 1 tablet (40 mg total) by mouth daily., Disp: 30 tablet, Rfl: 3   atorvastatin (LIPITOR) 10 MG  tablet, Take 10 mg by mouth daily., Disp: , Rfl:    buPROPion (WELLBUTRIN XL) 150 MG 24 hr tablet, Take 1 tablet (150 mg total) by mouth daily., Disp: 90 tablet, Rfl: 1   fenofibrate 160 MG tablet, Take 160 mg by mouth daily., Disp: , Rfl:    hydrOXYzine (ATARAX) 25 MG tablet, TAKE 1 TABLET BY MOUTH 3 TIMES  DAILY AS NEEDED, Disp: 270 tablet, Rfl: 0   hydrOXYzine (ATARAX) 50 MG tablet, Take 1 tablet (50 mg total) by mouth 3 (three) times daily as needed., Disp: 90 tablet, Rfl: 2   lamoTRIgine (LAMICTAL) 100 MG tablet, Take 1 tablet (100 mg total) by mouth daily., Disp: 30 tablet, Rfl: 2   metFORMIN (GLUCOPHAGE-XR) 500 MG 24 hr tablet, Take 500 mg by mouth 2 (two) times daily., Disp: , Rfl:    rOPINIRole (REQUIP) 1 MG tablet, Take 1 mg by mouth at bedtime., Disp: , Rfl:    traZODone (DESYREL) 150 MG tablet, Take 1 tablet (150 mg total) by mouth at bedtime., Disp: 90 tablet, Rfl: 3   TRULICITY 0.75 MG/0.5ML SOPN, Inject 0.75 mg into the skin every 14 (fourteen) days., Disp: , Rfl:    Vilazodone HCl (VIIBRYD) 40 MG TABS, TAKE 1 TABLET BY MOUTH DAILY, Disp: 90 tablet, Rfl: 3 Medication Side Effects: none  Family Medical/ Social History: Changes? No  MENTAL HEALTH EXAM:  There were no vitals taken for this visit.There is no height or weight on file to calculate BMI.  General Appearance: Casual and  work Museum/gallery exhibitions officer:  Good  Speech:  Talkative  Volume:  Increased  Mood:  Anxious, Depressed, Dysphoric, and Irritable  Affect:  Depressed and Anxious  Thought Process:  Coherent  Orientation:  Full (Time, Place, and Person)  Thought Content: Logical   Suicidal Thoughts:  No  Homicidal Thoughts:  No  Memory:  WNL  Judgement:  Good  Insight:  Good  Psychomotor Activity:  Normal  Concentration:  Concentration: Good  Recall:  Good  Fund of Knowledge: Good  Language: Good  Assets:  Desire for Improvement Financial Resources/Insurance Resilience Social Support  ADL's:  Intact   Cognition: WNL  Prognosis:  Good    DIAGNOSES:    ICD-10-CM   1. Irritability  R45.4 lamoTRIgine (LAMICTAL) 100 MG tablet    Vilazodone HCl (VIIBRYD) 40 MG TABS    2. Unspecified mood (affective) disorder  F39 lamoTRIgine (LAMICTAL) 100 MG tablet    Vilazodone HCl (VIIBRYD) 40 MG TABS    buPROPion (WELLBUTRIN XL) 150 MG 24 hr tablet    cariprazine (VRAYLAR) 1.5 MG capsule    3. Generalized anxiety disorder  F41.1 Vilazodone HCl (VIIBRYD) 40 MG TABS    buPROPion (WELLBUTRIN XL) 150 MG 24 hr tablet    hydrOXYzine (ATARAX) 50 MG tablet    cariprazine (VRAYLAR) 1.5 MG capsule    4. Major depressive disorder, recurrent episode, moderate  F33.1 Vilazodone HCl (VIIBRYD) 40 MG TABS    buPROPion (WELLBUTRIN XL) 150 MG 24 hr tablet    cariprazine (VRAYLAR) 1.5 MG capsule    5. Insomnia due to other mental disorder  F51.05 traZODone (DESYREL) 150 MG tablet   F99       Receiving Psychotherapy: No    RECOMMENDATIONS:   Greater than 50% of  40 min face to face time with patient was spent on counseling and coordination of care.  We discussed his recent decline with anxiety and depression. He and his wife resumed smoking cannabis. He in intense this visit and agitated. Face is red and looks distressed. We discussed family dynamics and his report of financial difficulties. His wife is also struggling with mental illness. We discussed his medications and he insures me of compliance but is asking for changes to his medications and help. Says he cannot continue like this. We reviewed his medication and discussed other alternatives. He requested 6 weeks f/u because of finances but agrees to call in 2-3 weeks to report his progress.  His sleeping has improved.  I counseled him on how cannabis could complicate his tx and I recommended patient seek psychotherapy. He declined at this time.   We agreed to: Continue Viibryd to 40 mg daily Continue Trazodone 150 mg daily at bedtime Will reduce Wellbutrin  to 75 mg for one week, then 37.5 mg for one week, then stop To start Vraylar 1.5 mg daily. Leafy Kindle is only FDA approved medication for treatment of MDD as adjunctive therapy. Pt also has severe agitation and irritability. Two weeks samples provided.  Continue Hydroxyzine to 25 mg three times daily Increase Lamictal to 100 mg at bedtime.  Will report worsening symptoms or side effects promptly To follow up in 6 weeks to reassess per pt Provided emergency contact information Reviewed PDMP     Joan Flores, NP

## 2023-01-31 ENCOUNTER — Telehealth: Payer: Self-pay | Admitting: Behavioral Health

## 2023-01-31 NOTE — Telephone Encounter (Signed)
Next appt is 02/23/23. Marcelles called and said that the Leafy Kindle is not helping him. States the Trazodone helps him sleep but not the Vraylar. Please call him at [phone].  Pharmacy is:  [facility] [address] - [place], [place] - [address]   Phone: [phone]  Fax: [phone]

## 2023-01-31 NOTE — Telephone Encounter (Signed)
Patient said Maxwell Crawford is working well for him during the day, but said since starting it he is not able to sleep. He takes 150 mg of trazodone and can get to sleep but wakes up about every 30 minutes. He did not have this issue prior to [employer]. He has only been on the medication about 2 weeks. I told him that he hadn't been on it long enough to achieve full benefit, but that I would let you know about sleep issues.

## 2023-02-01 NOTE — Telephone Encounter (Signed)
Patient notified

## 2023-02-06 ENCOUNTER — Telehealth: Payer: Self-pay | Admitting: Behavioral Health

## 2023-02-06 NOTE — Telephone Encounter (Signed)
Maxwell Crawford called at 4:35 to ask that [name] call him back and no one else. In summary he has lost his insurance and needs to discuss medication between now and when he will get insurance again. He has appt 5/24 that he keep but needs to discuss he medication refills now so he can get what he can before he totally loses coverage.

## 2023-02-18 ENCOUNTER — Other Ambulatory Visit: Payer: Self-pay | Admitting: Behavioral Health

## 2023-02-18 DIAGNOSIS — F411 Generalized anxiety disorder: Secondary | ICD-10-CM

## 2023-02-23 ENCOUNTER — Ambulatory Visit: Payer: Commercial Managed Care - PPO | Admitting: Behavioral Health

## 2023-03-29 ENCOUNTER — Telehealth: Payer: Self-pay | Admitting: Behavioral Health

## 2023-03-29 NOTE — Telephone Encounter (Signed)
Pt called and said that he still doesn't have insurance and he has been on vraylar samples. He can't afford the medicine without insurance. [name] had said that if he didn't get insurance brian had a different medicine to try instead. Please call him at [phone]

## 2023-03-29 NOTE — Telephone Encounter (Signed)
According to your note you spoke with him May 9th about this.

## 2023-04-02 ENCOUNTER — Other Ambulatory Visit: Payer: Self-pay

## 2023-04-02 DIAGNOSIS — F39 Unspecified mood [affective] disorder: Secondary | ICD-10-CM

## 2023-04-02 DIAGNOSIS — F331 Major depressive disorder, recurrent, moderate: Secondary | ICD-10-CM

## 2023-04-02 DIAGNOSIS — F411 Generalized anxiety disorder: Secondary | ICD-10-CM

## 2023-04-02 MED ORDER — CARIPRAZINE HCL 1.5 MG PO CAPS: 1.5000 mg | ORAL_CAPSULE | Freq: Every day | ORAL | 1 refills | Status: AC

## 2023-04-02 NOTE — Telephone Encounter (Signed)
Rtc to pt and he reports he is not sure when he will be getting insurance. He said someone gave him information on a co-pay card but those only work if he has Nurse, learning disability. He reports thinking the Abilify didn't work too well and says that was an add on to something else but can't remember what medication that was.  Informed him I would pull 1 month samples of Vraylar 1.5 mg and maybe we could try to apply for pt assistance. I do also have a 30 day Voucher from [employer] that I will put in his bag as well. No insurance needed for vouchers.   His wife will be picking up the samples today or tomorrow.

## 2023-04-02 NOTE — Telephone Encounter (Signed)
Submitted 30 day supply of Vraylar 1.5 mg to [facility] for use of voucher

## 2023-04-30 ENCOUNTER — Other Ambulatory Visit: Payer: Self-pay | Admitting: Behavioral Health

## 2023-04-30 DIAGNOSIS — F39 Unspecified mood [affective] disorder: Secondary | ICD-10-CM

## 2023-04-30 DIAGNOSIS — R454 Irritability and anger: Secondary | ICD-10-CM

## 2023-07-20 ENCOUNTER — Ambulatory Visit: Payer: Self-pay | Admitting: Behavioral Health

## 2023-09-15 IMAGING — CT CT RENAL STONE PROTOCOL
2 of 4 series · 16 of 46 positions shown, 18 images · non-contrast
Comparison: Sonogram done earlier today

CLINICAL DATA: Right flank pain

EXAM:
CT ABDOMEN AND PELVIS WITHOUT CONTRAST
TECHNIQUE: Multidetector CT imaging of the abdomen and pelvis was performed
following the standard protocol without IV contrast.

[Series 3: stone study 5.0 i30f 2 · axial · 0.98mm/px · z∈[+718,+1158]mm · 13 of 97 slices shown, 15 images]
[im 5/97  soft-tissue]
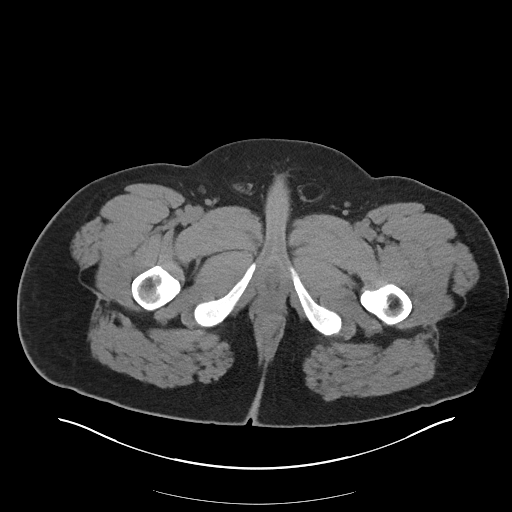
[im 5/97  bone]
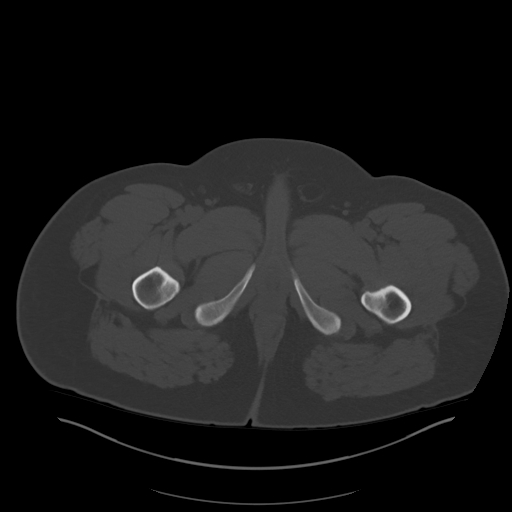
[im 13/97  soft-tissue]
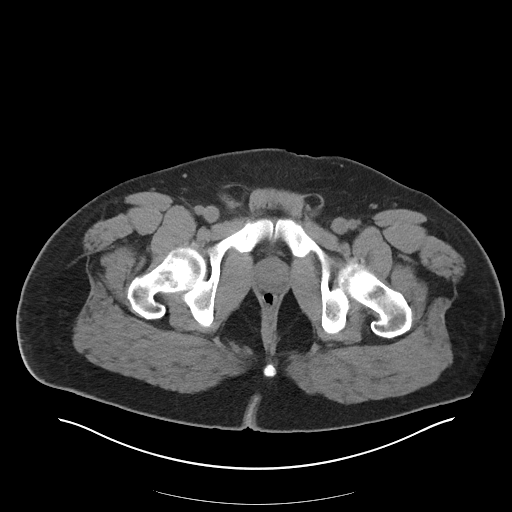
[im 21/97  soft-tissue]
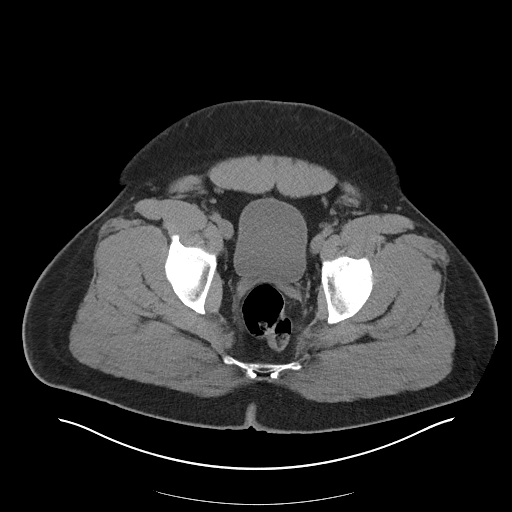
[im 29/97  soft-tissue]
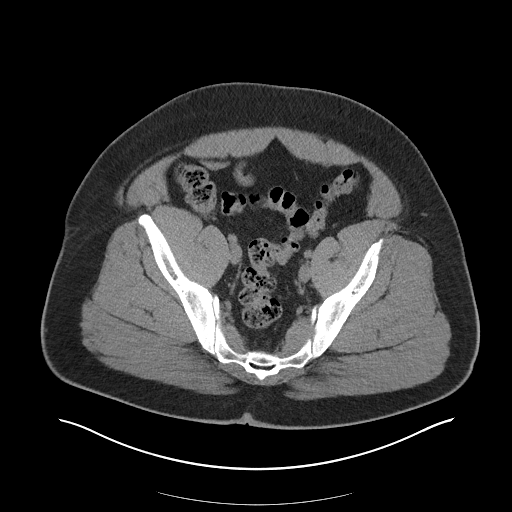
[im 33/97  soft-tissue]
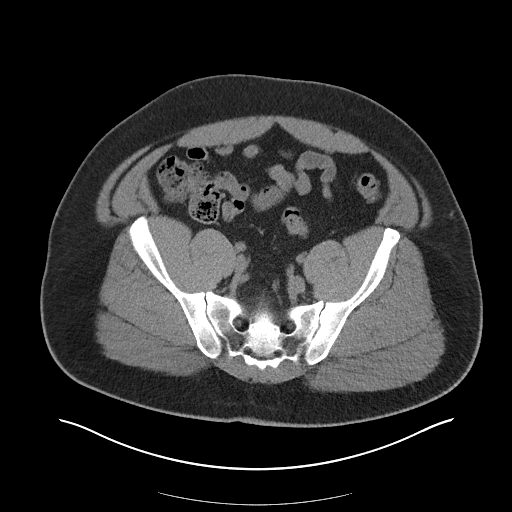
[im 41/97  soft-tissue]
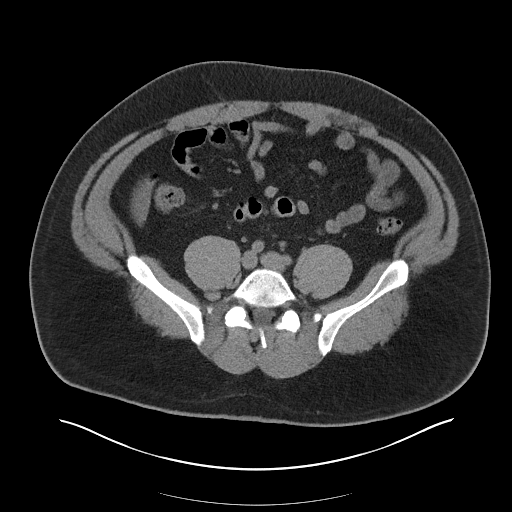
[im 49/97  soft-tissue]
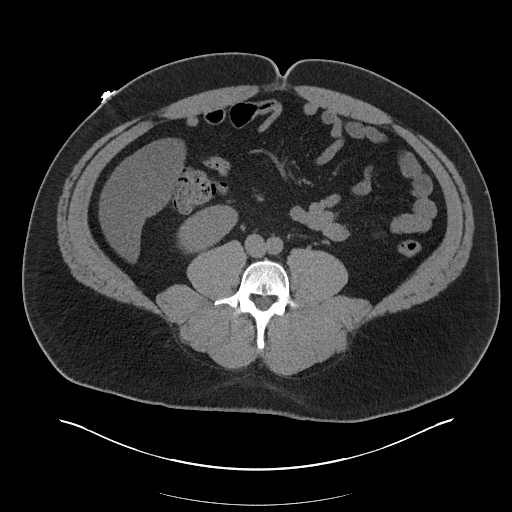
[im 57/97  soft-tissue]
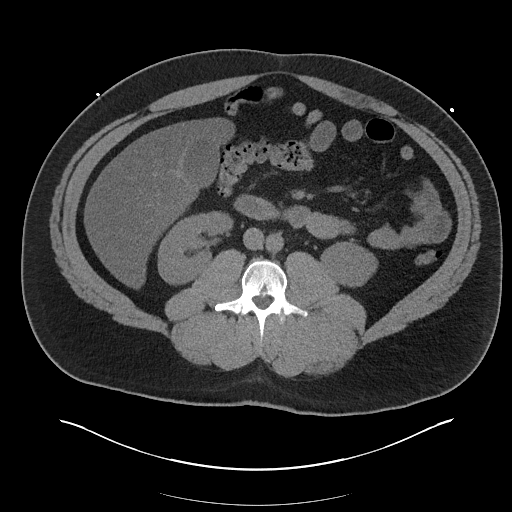
[im 65/97  soft-tissue]
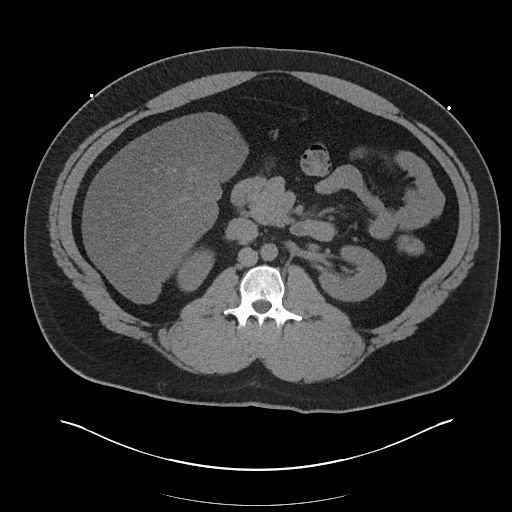
[im 65/97  bone]
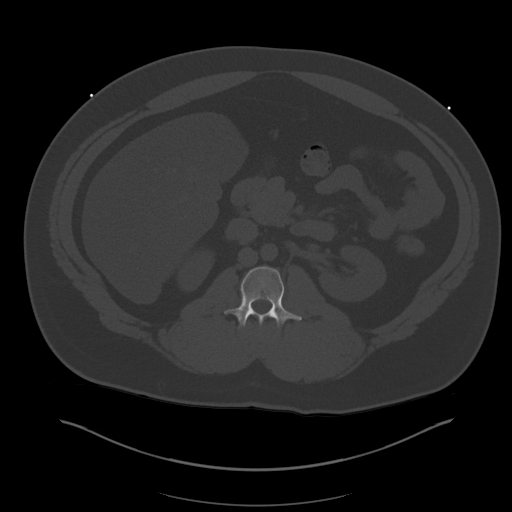
[im 69/97  soft-tissue]
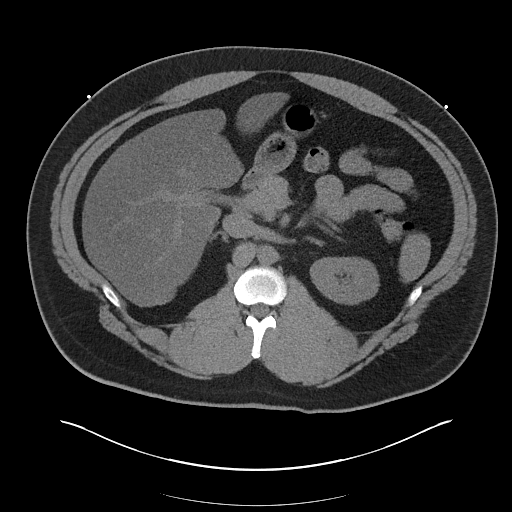
[im 77/97  soft-tissue]
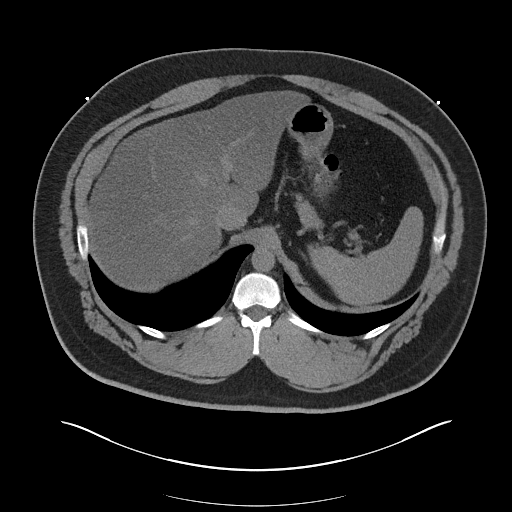
[im 85/97  soft-tissue]
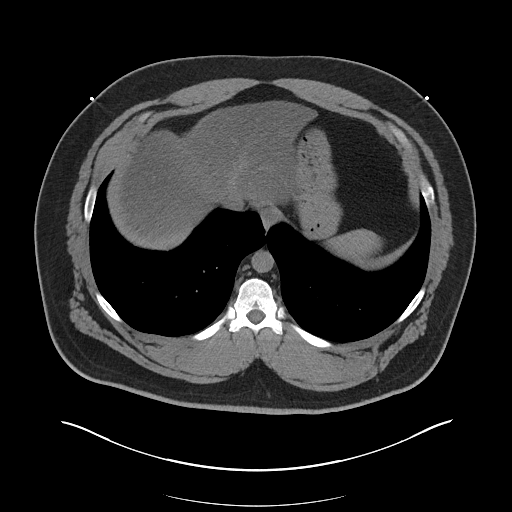
[im 93/97  soft-tissue]
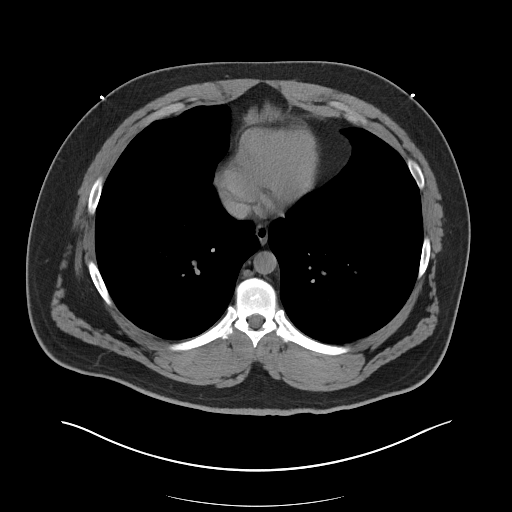

[Series 6: coronal soft tissue · coronal · 0.95mm/px · 3 of 136 slices shown]
[im 46/136  soft-tissue]
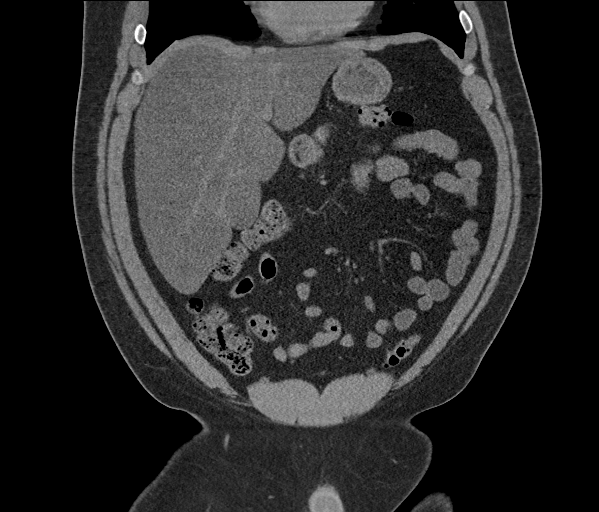
[im 61/136  soft-tissue]
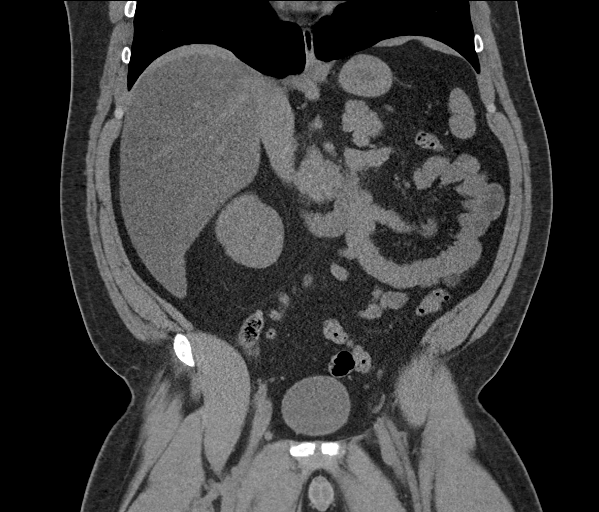
[im 76/136  soft-tissue]
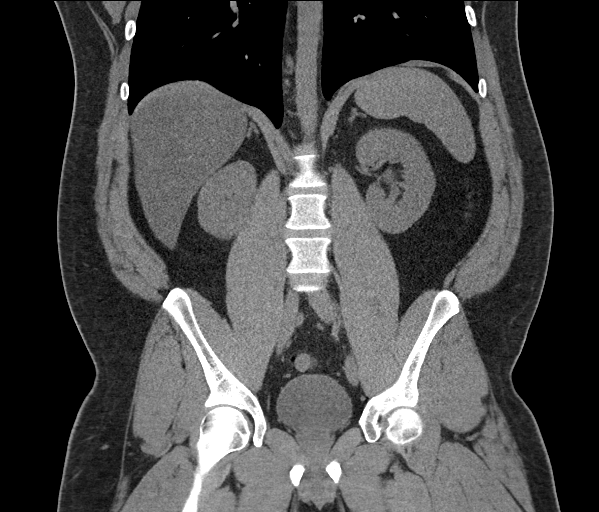

[16 of 46 positions shown; findings below may reference images not displayed]

FINDINGS: Lower chest: Unremarkable.

Hepatobiliary: Liver measures 24.1 cm in length. There is fatty
infiltration. There is subtle ill-defined increased density in the
posterior right lobe in image 35 of series 3. Evaluation of this
finding is limited in this noncontrast study. There is no dilation
of bile ducts. Gallbladder is unremarkable.

Pancreas: No focal abnormality is seen.

Spleen: Unremarkable.

Adrenals/Urinary Tract: Adrenals are unremarkable. There is no
hydronephrosis. There are no renal or ureteral stones. Urinary
bladder is unremarkable.

Stomach/Bowel: Stomach is not distended. Small bowel loops are not
dilated. Appendix is not dilated. There is no significant wall
thickening in colon. There is no pericolic stranding.

Vascular/Lymphatic: Unremarkable.

Reproductive: Unremarkable.

Other: There is no ascites or pneumoperitoneum. Small umbilical
hernia containing fat is seen. Small bilateral inguinal hernias
containing fat are noted.

Musculoskeletal: Unremarkable.
IMPRESSION: There is no evidence intestinal obstruction or pneumoperitoneum.
Appendix is not dilated. There is no hydronephrosis.

Enlarged fatty liver.

Other findings as described in the body of the report.

## 2023-09-15 IMAGING — US US ABDOMEN LIMITED
1 series · 14 of 25 positions shown · non-contrast
Comparison: 12/16/2020

CLINICAL DATA: RIGHT upper quadrant abdominal pain

EXAM:
ULTRASOUND ABDOMEN LIMITED RIGHT UPPER QUADRANT

[Series 1: us abdomen limited ruq (liver/gb) · 14 of 63 slices shown]
[im 1/63]
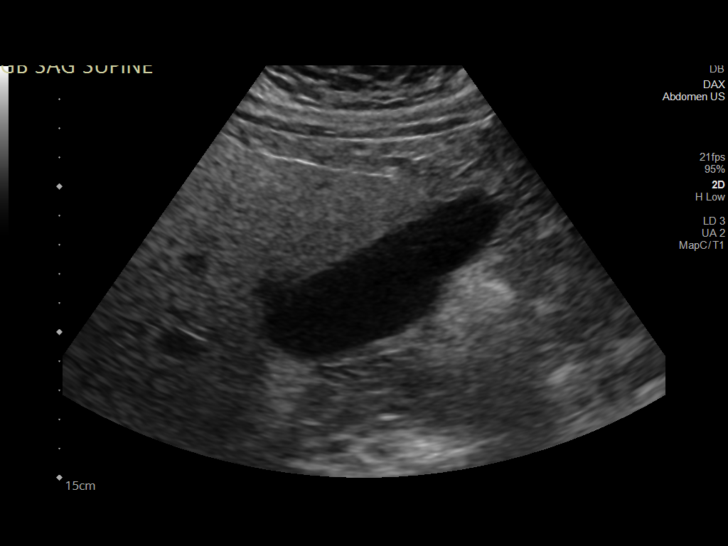
[im 6/63]
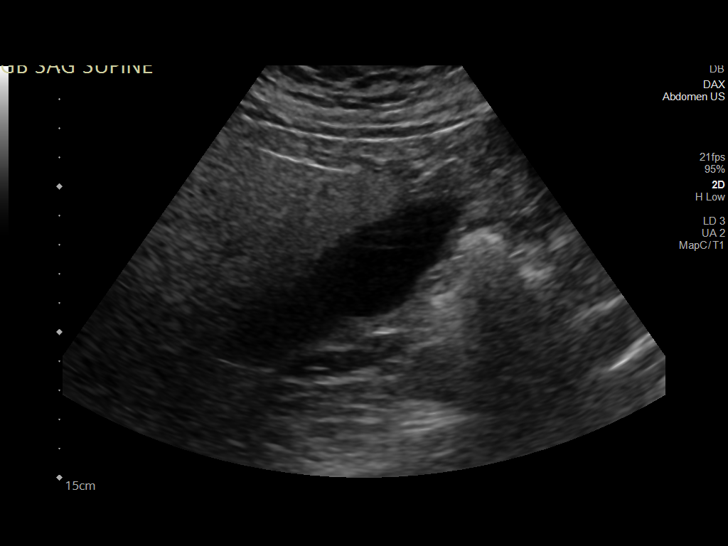
[im 11/63]
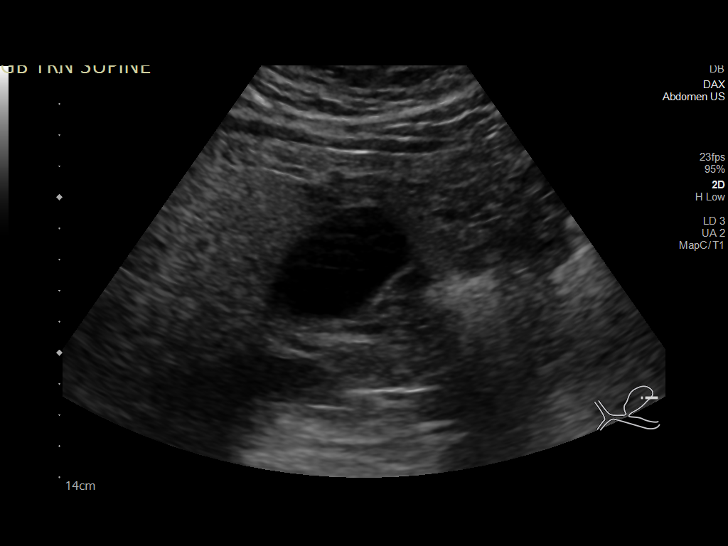
[im 16/63]
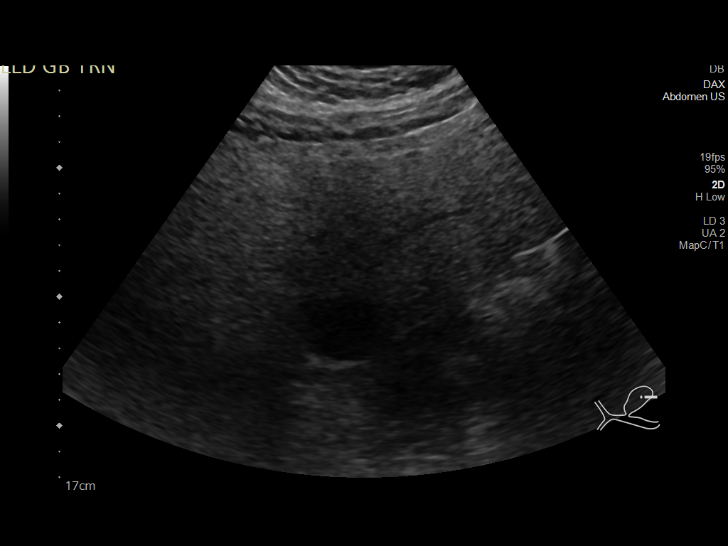
[im 21/63]
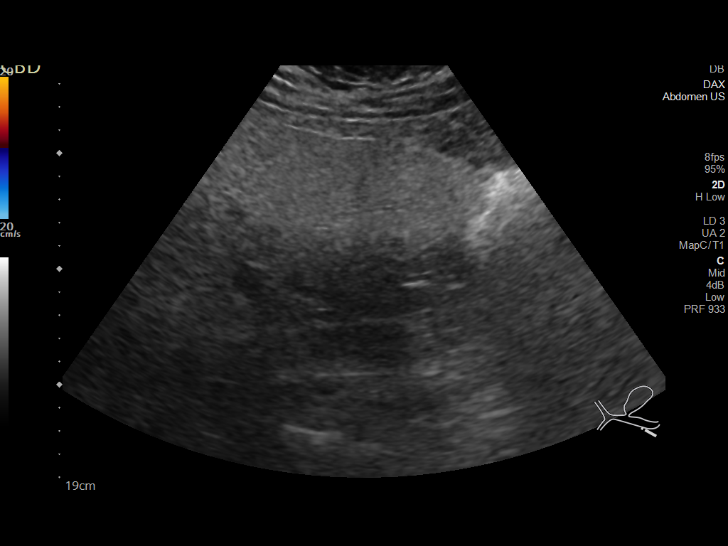
[im 24/63]
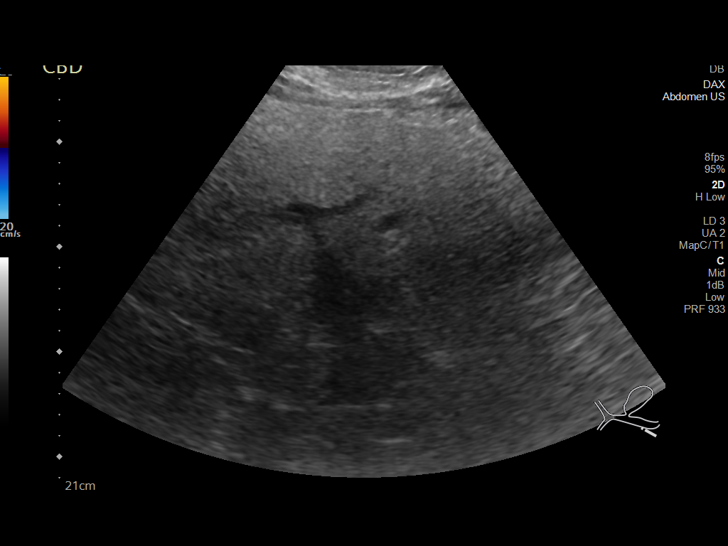
[im 29/63]
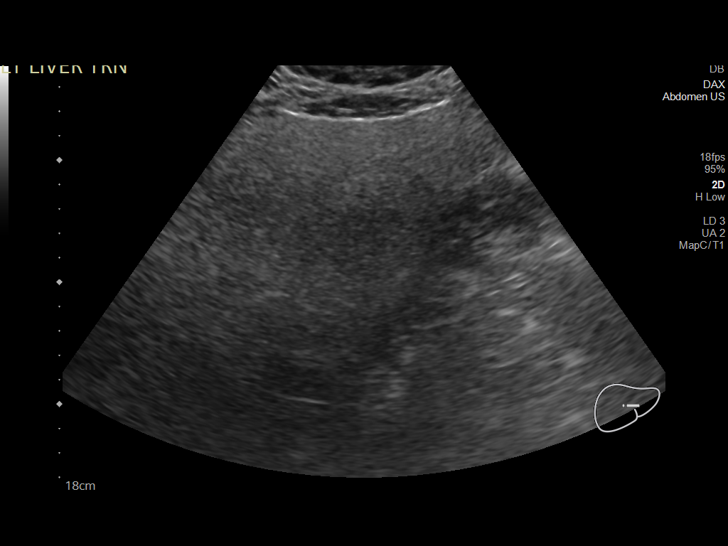
[im 34/63]
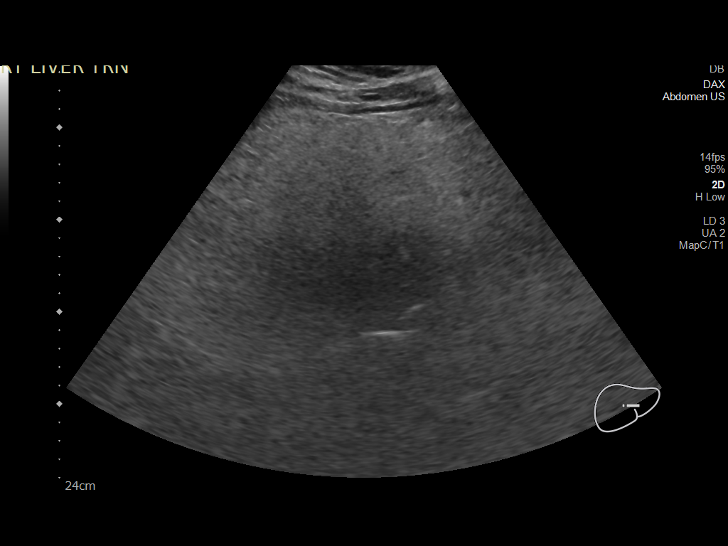
[im 39/63]
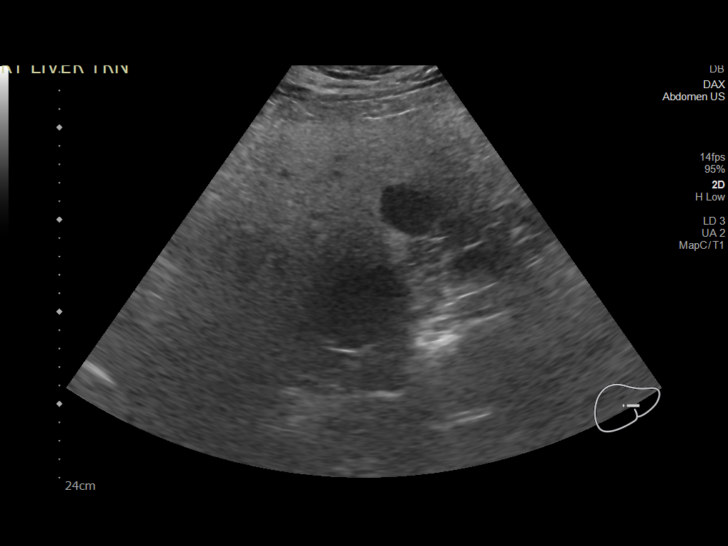
[im 42/63]
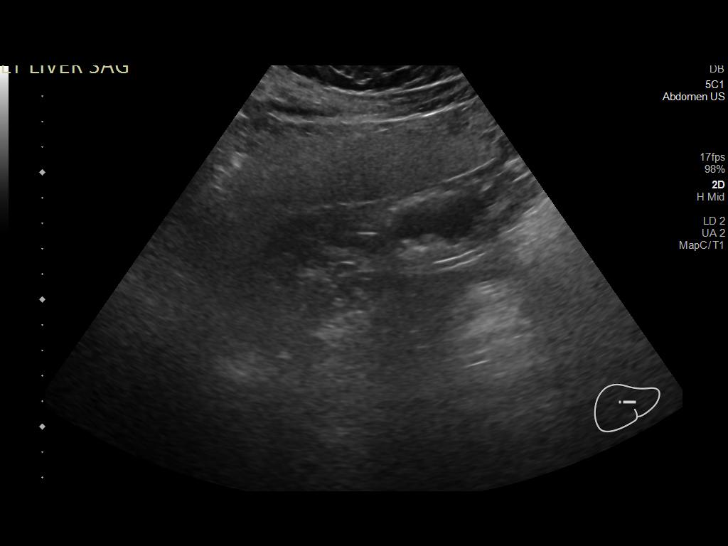
[im 47/63]
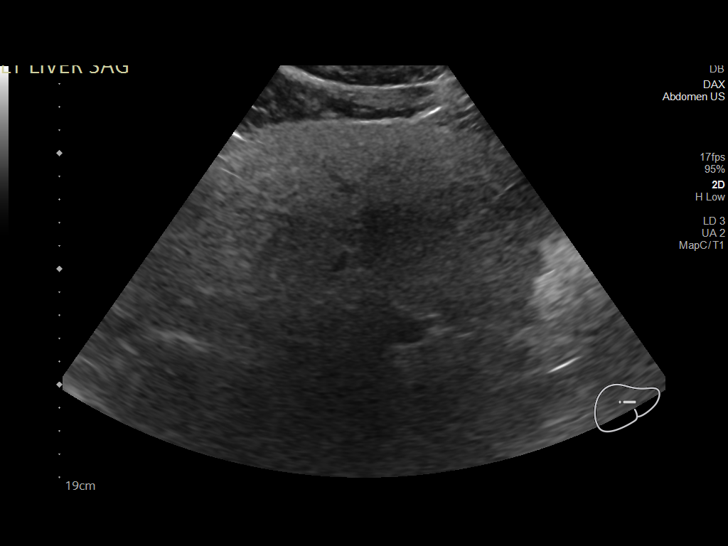
[im 52/63]
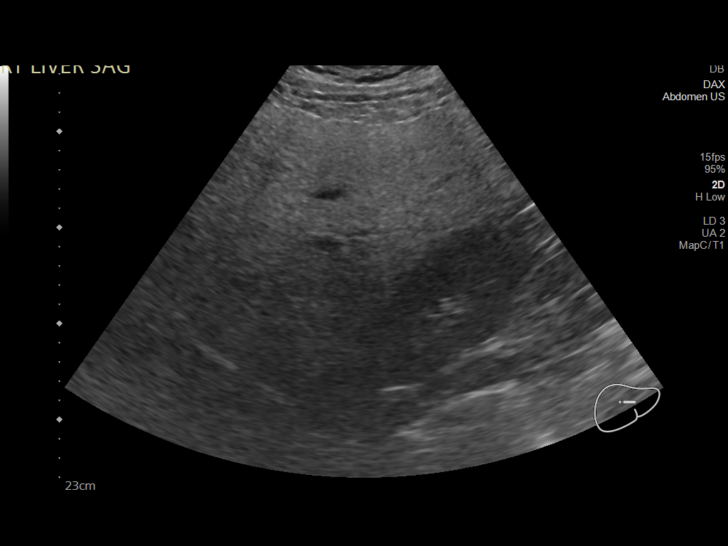
[im 57/63]
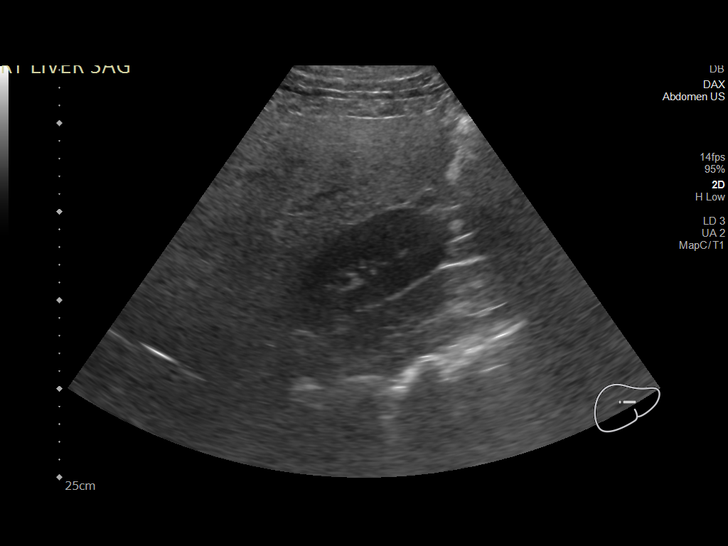
[im 63/63]
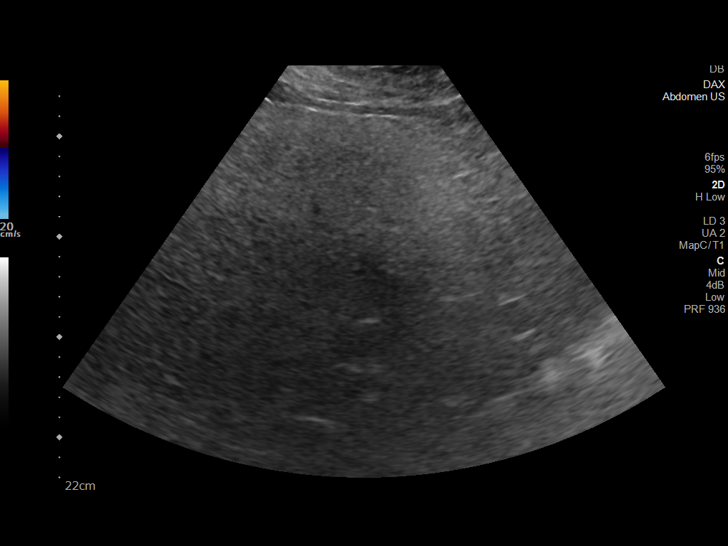

[14 of 25 positions shown; findings below may reference images not displayed]

FINDINGS: Gallbladder:

Normally distended without stones or wall thickening. No
pericholecystic fluid or sonographic Murphy sign.

Common bile duct:

Diameter: 5 mm, normal

Liver:

Echogenic parenchyma, likely fatty infiltration though this can be
seen with cirrhosis and certain infiltrative disorders. No focal
hepatic mass or nodularity. Portal vein is patent on color Doppler
imaging with normal direction of blood flow towards the liver.

Other: No RIGHT upper quadrant free fluid.
IMPRESSION: Probable fatty infiltration of liver as above.

Otherwise negative exam.

## 2024-01-30 ENCOUNTER — Other Ambulatory Visit: Payer: Self-pay | Admitting: Behavioral Health

## 2024-01-30 DIAGNOSIS — F5105 Insomnia due to other mental disorder: Secondary | ICD-10-CM
